# Patient Record
Sex: Female | Born: 1990 | Race: White | Hispanic: No | Marital: Single | Smoking: Never smoker
Health system: Southern US, Community
[De-identification: ages and names within clinical notes are randomized; demographics above are authoritative.]

## PROBLEM LIST (undated history)

## (undated) DIAGNOSIS — M419 Scoliosis, unspecified: Secondary | ICD-10-CM

## (undated) DIAGNOSIS — T7840XA Allergy, unspecified, initial encounter: Secondary | ICD-10-CM

## (undated) HISTORY — PX: WISDOM TOOTH EXTRACTION: SHX21

## (undated) HISTORY — DX: Allergy, unspecified, initial encounter: T78.40XA

## (undated) HISTORY — PX: DENTAL SURGERY: SHX609

---
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
## 2011-02-27 ENCOUNTER — Ambulatory Visit
Admission: RE | Admit: 2011-02-27 | Discharge: 2011-02-27 | Disposition: A | Discharge status: Home / Self Care | Payer: BC Managed Care – PPO | Source: Ambulatory Visit | Attending: Family Medicine | Admitting: Family Medicine

## 2011-02-27 ENCOUNTER — Other Ambulatory Visit: Payer: Self-pay | Admitting: Family Medicine

## 2011-02-27 DIAGNOSIS — R1031 Right lower quadrant pain: Secondary | ICD-10-CM

## 2011-02-27 MED ORDER — IOHEXOL 300 MG/ML  SOLN
100.0000 mL | Freq: Once | INTRAMUSCULAR | Status: AC | PRN
  Administered 2011-02-27: 100 mL via INTRAVENOUS

## 2011-03-12 ENCOUNTER — Other Ambulatory Visit: Payer: Self-pay | Admitting: Internal Medicine

## 2011-03-12 ENCOUNTER — Ambulatory Visit
Admission: RE | Admit: 2011-03-12 | Discharge: 2011-03-12 | Disposition: A | Discharge status: Home / Self Care | Payer: BC Managed Care – PPO | Source: Ambulatory Visit | Attending: Internal Medicine | Admitting: Internal Medicine

## 2011-03-12 DIAGNOSIS — R1031 Right lower quadrant pain: Secondary | ICD-10-CM

## 2011-06-05 ENCOUNTER — Emergency Department (HOSPITAL_COMMUNITY)
Admission: EM | Admit: 2011-06-05 | Discharge: 2011-06-06 | Disposition: A | Discharge status: Home / Self Care | Payer: BC Managed Care – PPO | Attending: Emergency Medicine | Admitting: Emergency Medicine

## 2011-06-05 DIAGNOSIS — M436 Torticollis: Secondary | ICD-10-CM | POA: Insufficient documentation

## 2011-06-05 DIAGNOSIS — M542 Cervicalgia: Secondary | ICD-10-CM | POA: Insufficient documentation

## 2011-06-05 DIAGNOSIS — H6120 Impacted cerumen, unspecified ear: Secondary | ICD-10-CM | POA: Insufficient documentation

## 2011-06-05 DIAGNOSIS — H9209 Otalgia, unspecified ear: Secondary | ICD-10-CM | POA: Insufficient documentation

## 2011-06-06 ENCOUNTER — Encounter (HOSPITAL_COMMUNITY): Payer: Self-pay | Admitting: Radiology

## 2011-06-06 ENCOUNTER — Emergency Department (HOSPITAL_COMMUNITY): Payer: BC Managed Care – PPO

## 2011-06-06 LAB — POCT I-STAT, CHEM 8
BUN: 11 mg/dL (ref 6–23)
Calcium, Ion: 1.21 mmol/L (ref 1.12–1.32)
Creatinine, Ser: 0.6 mg/dL (ref 0.50–1.10)
TCO2: 20 mmol/L (ref 0–100)

## 2011-06-06 MED ORDER — IOHEXOL 350 MG/ML SOLN
100.0000 mL | Freq: Once | INTRAVENOUS | Status: AC | PRN
  Administered 2011-06-06: 100 mL via INTRAVENOUS

## 2011-11-29 ENCOUNTER — Ambulatory Visit (INDEPENDENT_AMBULATORY_CARE_PROVIDER_SITE_OTHER): Payer: BC Managed Care – PPO

## 2011-11-29 DIAGNOSIS — Z23 Encounter for immunization: Secondary | ICD-10-CM

## 2011-11-29 DIAGNOSIS — J029 Acute pharyngitis, unspecified: Secondary | ICD-10-CM

## 2011-12-08 ENCOUNTER — Ambulatory Visit (INDEPENDENT_AMBULATORY_CARE_PROVIDER_SITE_OTHER): Payer: BC Managed Care – PPO

## 2011-12-08 DIAGNOSIS — J029 Acute pharyngitis, unspecified: Secondary | ICD-10-CM

## 2011-12-08 DIAGNOSIS — R05 Cough: Secondary | ICD-10-CM

## 2012-10-17 ENCOUNTER — Ambulatory Visit (INDEPENDENT_AMBULATORY_CARE_PROVIDER_SITE_OTHER): Payer: BC Managed Care – PPO | Admitting: Family Medicine

## 2012-10-17 VITALS — BP 100/60 | HR 78 | Temp 98.8°F | Resp 18 | Ht 64.0 in | Wt 131.0 lb

## 2012-10-17 DIAGNOSIS — G43909 Migraine, unspecified, not intractable, without status migrainosus: Secondary | ICD-10-CM

## 2012-10-17 MED ORDER — TRAMADOL HCL 50 MG PO TABS: 50.0000 mg | ORAL_TABLET | Freq: Three times a day (TID) | ORAL | Status: DC | PRN

## 2012-10-17 NOTE — Progress Notes (Signed)
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 Over the last 2 weeks she's been having more frequent and prolonged headaches. The longest lasting about 2 days. Yesterday at Thanksgiving dinner she had a bad headache and her family insisted she come seen. Her mother does have a history of migraines. The patient has been quite healthy, is not on any regular medications. Her last cycle was over 3 weeks ago. She is not sexually involved and on contraceptions she is under some stress with exams coming up. She does get some excess every couple days that he most recent headaches have been behind a left orbit and forehead area. She denies any prodromal type symptoms. She has not had any visual auras sometimes feels like her vision is a little bit blurry.  Objective: Pleasant alert healthy appearing young lady in no major distress this time. TMs are normal. Eyes PERRLA. Fundi benign. Throat clear. Neck supple without nodes. Chest is clear to auscultation. Heart regular without murmurs.  Assessment: Migraines  Plan: See patient instructions. Tramadol when necessary for worse pain.

## 2012-10-17 NOTE — Patient Instructions (Signed)
Take Tylenol 1000 mg every 6 or 8 hours if needed for headache pain. An alternative is ibuprofen 800 mg every 8-12 hours.  If headache pain persists despite this, use tramadol 50 mg every 6 when necessary pain.  Return if headache problems continued to persist  Daily try to get regular exercise and enough rest.

## 2012-10-30 ENCOUNTER — Ambulatory Visit (INDEPENDENT_AMBULATORY_CARE_PROVIDER_SITE_OTHER): Payer: BC Managed Care – PPO | Admitting: Physician Assistant

## 2012-10-30 VITALS — BP 124/76 | HR 91 | Temp 98.3°F | Resp 17 | Ht 64.0 in | Wt 123.0 lb

## 2012-10-30 DIAGNOSIS — R51 Headache: Secondary | ICD-10-CM

## 2012-10-30 MED ORDER — BUTALBITAL-APAP-CAFFEINE 50-325-40 MG PO TABS: 1.0000 | ORAL_TABLET | Freq: Four times a day (QID) | ORAL | Status: DC | PRN

## 2012-10-30 NOTE — Progress Notes (Signed)
Subjective:    Patient ID: Anne Flores, female    DOB: [birth year], 21 y.o.   MRN: [id number]  HPI   Anne Flores is a 21 yr old female complaining of worsening headaches over the last several weeks.  Headaches began about 4 weeks ago.  Describes the headaches as "stabs of pain" that lasted a few minutes.  She was seen here by Dr. Alwyn Ren about 2 weeks ago and prescribed tramadol for pain relief.  Since that time she states the headaches have been worsening both in frequency and intensity.  Now the headaches are happening once or twice per day and lasting 30-45 minutes at a time, whereas they previously resolved in minutes.  The pain is always above the left eye.  It does not radiate.  She does not have a prodrome or aura.  She does state that she "gets kind of numb" in both of her arms, and they tingle and shake with her worst headaches.  Denies dizziness, syncope, weakness, slurred speech.  Occasionally feels "unbalanced".  Denies nausea, vomiting, photophobia, or phonophobia.  Has been using tylenol, advil, or tramadol for HA relief, none of which provide much relief.  Does endorse that she's had some blurred vision in the left eye on occasion, not necessarily associated with the headaches.  No glass or contacts.  She does not have a headache currently.  Mother has a history of migraines.     Review of Systems  Constitutional: Negative for fever, chills and diaphoresis.  HENT: Positive for tinnitus (occasionally, not new). Negative for ear pain, neck pain and neck stiffness.   Respiratory: Negative for cough, shortness of breath and wheezing.   Cardiovascular: Negative.  Negative for chest pain and palpitations.  Gastrointestinal: Negative.   Musculoskeletal: Negative.   Skin: Negative.   Neurological: Positive for light-headedness, numbness (bilat arms) and headaches. Negative for dizziness, syncope and weakness.       Objective:   Physical Exam  Vitals reviewed. Constitutional: She is  oriented to person, place, and time. She appears well-developed and well-nourished. No distress.  HENT:  Head: Normocephalic and atraumatic.  Right Ear: Tympanic membrane and ear canal normal.  Left Ear: Tympanic membrane and ear canal normal.  Nose: Nose normal. Right sinus exhibits no maxillary sinus tenderness and no frontal sinus tenderness. Left sinus exhibits no maxillary sinus tenderness and no frontal sinus tenderness.  Mouth/Throat: Uvula is midline, oropharynx is clear and moist and mucous membranes are normal.  Eyes: Conjunctivae normal and EOM are normal. Pupils are equal, round, and reactive to light.  Fundoscopic exam:      The right eye shows no arteriolar narrowing, no AV nicking and no papilledema.       The left eye shows no arteriolar narrowing, no AV nicking and no papilledema.  Neck: Neck supple. No spinous process tenderness and no muscular tenderness present.  Cardiovascular: Normal rate, regular rhythm and normal heart sounds.   Pulses:      Radial pulses are 2+ on the right side, and 2+ on the left side.  Pulmonary/Chest: Effort normal and breath sounds normal. She has no wheezes. She has no rhonchi. She has no rales.  Lymphadenopathy:    She has no cervical adenopathy.  Neurological: She is alert and oriented to person, place, and time. She has normal strength. No cranial nerve deficit or sensory deficit.  Reflex Scores:      Bicep reflexes are 3+ on the right side and 3+ on the left side.  Patellar reflexes are 3+ on the right side and 3+ on the left side. Skin: Skin is warm and dry.      Filed Vitals:   10/30/12 1049  BP: 124/76  Pulse: 91  Temp: 98.3 F (36.8 C)  Resp: 17       Assessment & Plan:   1. Headache  butalbital-acetaminophen-caffeine (FIORICET) 50-325-40 MG per tablet, AMB referral to headache clinic    Anne Flores is a very pleasant 21 yr old female here for worsening headaches over the last two weeks.  Vitals are WNL, physical  exam is reassuring, and there are no focal neuro deficits.  As her symptoms are not classic migraine symptoms, I [facility].  Will try Fioricet for headache/migraine relief.  Discussed RTC precautions, specifically worsening HA, syncope, slurred speech, etc.  Patient voiced understanding and is in agreement with this plan.

## 2012-10-30 NOTE — Patient Instructions (Addendum)
I have put in a referral to Dr. Neale Burly at [facility].  You will get a phone call about scheduling this.  In the mean time, use Fioricet as needed for headaches.  You may take 1-2 tabs (start with just one) every 6 hours as needed (but do not exceed 6 tabs in 24 hours).  If you have new or worsening symptoms (i.e. Worsening headaches,  Dizziness, passing out, slurred speech, etc) come back in or proceed to the emergency department.

## 2012-12-27 ENCOUNTER — Ambulatory Visit (INDEPENDENT_AMBULATORY_CARE_PROVIDER_SITE_OTHER): Payer: BC Managed Care – PPO | Admitting: Family Medicine

## 2012-12-27 ENCOUNTER — Telehealth: Payer: Self-pay

## 2012-12-27 VITALS — BP 103/62 | HR 82 | Temp 98.2°F | Resp 16 | Ht 64.0 in | Wt 126.0 lb

## 2012-12-27 DIAGNOSIS — R21 Rash and other nonspecific skin eruption: Secondary | ICD-10-CM

## 2012-12-27 DIAGNOSIS — R002 Palpitations: Secondary | ICD-10-CM

## 2012-12-27 DIAGNOSIS — R5381 Other malaise: Secondary | ICD-10-CM

## 2012-12-27 DIAGNOSIS — G44229 Chronic tension-type headache, not intractable: Secondary | ICD-10-CM

## 2012-12-27 DIAGNOSIS — R5383 Other fatigue: Secondary | ICD-10-CM

## 2012-12-27 LAB — POCT UA - MICROSCOPIC ONLY
Bacteria, U Microscopic: NEGATIVE
RBC, urine, microscopic: NEGATIVE
WBC, Ur, HPF, POC: NEGATIVE
Yeast, UA: NEGATIVE

## 2012-12-27 LAB — POCT CBC
Hemoglobin: 12.5 g/dL (ref 12.2–16.2)
MPV: 9.3 fL (ref 0–99.8)
POC Granulocyte: 3.5 (ref 2–6.9)
POC MID %: 8.1 %M (ref 0–12)
RBC: 3.49 M/uL — AB (ref 4.04–5.48)

## 2012-12-27 LAB — POCT URINALYSIS DIPSTICK
Bilirubin, UA: NEGATIVE
Blood, UA: NEGATIVE
Glucose, UA: NEGATIVE
Nitrite, UA: NEGATIVE
Spec Grav, UA: >=1.03

## 2012-12-27 LAB — COMPREHENSIVE METABOLIC PANEL
AST: 16 U/L (ref 0–37)
Albumin: 4.6 g/dL (ref 3.5–5.2)
BUN: 11 mg/dL (ref 6–23)
Calcium: 9.4 mg/dL (ref 8.4–10.5)
Chloride: 105 mEq/L (ref 96–112)
Potassium: 3.7 mEq/L (ref 3.5–5.3)

## 2012-12-27 NOTE — Patient Instructions (Signed)
Continue all life his usual for now. We will see the results of your labs and decide if there is a need to pursue see anything else at this time. If you continue to have symptoms over the next couple of months return for a followup assessment.  Get me a copy of the labs you had done at the headache clinic

## 2012-12-27 NOTE — Telephone Encounter (Signed)
pts mother called very upset. States pt was seen this morning by dr hopper and came home upset and complaining that she believes 'dr hopper doesn't believe her' or her symptoms. She states that (mom) believes that it could be lyme disease because their dog recently tested positive for it and based on the patients symptoms she believes we should be testing her for it. Mom would like to speak to dr or his assistant about what happened during the visit and also what labs are being run on the patient.   Please call. Mom is listed on her hippa.   Best: [phone]  bf

## 2012-12-27 NOTE — Progress Notes (Signed)
Subjective: This young lady is here with a number of concerns. She's been feeling palpitations for the last week or so. This is a racing sensation in her chest. She says it is going on now. There is no pain with it. She is also had a sensation that things were falling when they were not falling or that she was staggering when she was not staggering. The headaches have continued to persist despite some of the changes that the neurologist made. She is following up with them this week. She was told that her HDL was too low, and was given a nut diet. She's been fatigued for the last 4 months so doesn't seem to be changing a lot. She denies excessive stress in school. She is a Primary school teacher. She lives at home and only has some relatively minor mother-daughter miscommunications. She thinks she has had some fevers off and on, feeling sweaty, but never having taken her temperature with it. She complains of a film sensation over her left eye intermittently. She has not seen an eye doctor.  After the fact she did tell me that her dog has had gallbladder disease and she wondered whether that was associated with her having these problems.  Objective: Well-developed young lady in no acute distress. TMs normal. Eyes PERRLA. Fundi benign. Discs flat. Throat clear. Neck supple without nodes or thyromegaly. Chest is clear to auscultation. Heart regular without murmurs gallops or arrhythmias. Abdomen soft without masses or tenderness. Extremities unremarkable. She also complained of a rash on both her hands, but says is doing a little better today. There is a little bit of dry skin on the hands.  Results for orders placed in visit on 12/27/12  POCT CBC      Result Value Range   WBC 5.5  4.6 - 10.2 K/uL   Lymph, poc 1.6  0.6 - 3.4   POC LYMPH PERCENT 28.5  10 - 50 %L   MID (cbc) 0.4  0 - 0.9   POC MID % 8.1  0 - 12 %M   POC Granulocyte 3.5  2 - 6.9   Granulocyte percent 63.4  37 - 80 %G   RBC 3.49 (*) 4.04 - 5.48  M/uL   Hemoglobin 12.5  12.2 - 16.2 g/dL   HCT, POC 16.1 (*) 09.6 - 47.9 %   MCV 95.3  80 - 97 fL   MCH, POC 35.8 (*) 27 - 31.2 pg   MCHC 37.5 (*) 31.8 - 35.4 g/dL   RDW, POC 04.5     Platelet Count, POC 165  142 - 424 K/uL   MPV 9.3  0 - 99.8 fL  POCT UA - MICROSCOPIC ONLY      Result Value Range   WBC, Ur, HPF, POC neg     RBC, urine, microscopic neg     Bacteria, U Microscopic neg     Mucus, UA moderate     Epithelial cells, urine per micros neg     Crystals, Ur, HPF, POC neg     Casts, Ur, LPF, POC tntc calcium oxalate     Yeast, UA neg    POCT URINALYSIS DIPSTICK      Result Value Range   Color, UA yellow     Clarity, UA cloudy     Glucose, UA neg     Bilirubin, UA neg     Ketones, UA neg     Spec Grav, UA >=1.030     Blood, UA neg  pH, UA 6.0     Protein, UA neg     Urobilinogen, UA 0.2     Nitrite, UA neg     Leukocytes, UA Negative     Assessment: Palpitations Chronic tension headache Film over eye Fatigue Rash on hands  Plan: Tried to reassure her until we get the results of her labs back. Even though the dog had a Lyme disease, do not feel like her symptoms are indicative of anything to be suspicious of. Reassured her. These may be all stress related symptoms even though she doesn't feel stressed. There is really no rash on the hands today to warrant giving medications for her. Return if in all worse.

## 2012-12-28 NOTE — Telephone Encounter (Signed)
Dr Alwyn Ren I think it would be a good idea to call mom to explain what happened at office visit.

## 2012-12-30 ENCOUNTER — Other Ambulatory Visit: Payer: BC Managed Care – PPO | Admitting: Family Medicine

## 2012-12-30 ENCOUNTER — Encounter: Payer: Self-pay | Admitting: *Deleted

## 2012-12-30 ENCOUNTER — Telehealth: Payer: Self-pay | Admitting: Family Medicine

## 2012-12-30 ENCOUNTER — Telehealth: Payer: Self-pay | Admitting: Radiology

## 2012-12-30 DIAGNOSIS — R5381 Other malaise: Secondary | ICD-10-CM

## 2012-12-30 DIAGNOSIS — Z139 Encounter for screening, unspecified: Secondary | ICD-10-CM

## 2012-12-30 NOTE — Telephone Encounter (Signed)
Spoke with Amy L. Who will arrange for patient to get f/u lyme test outpatient.

## 2012-12-30 NOTE — Telephone Encounter (Signed)
[facility] lab is doing the add on lab.

## 2012-12-30 NOTE — Telephone Encounter (Signed)
Spoke with mother on phone.  They are concerned since their dog got lyme, and patient had a tick bite last summer.  I will order a lyme test.  I spoke with the lab and Ginger will work on add on.

## 2012-12-30 NOTE — Telephone Encounter (Signed)
Lab only for the blood work. Mom advised. Order put in. The patient will not be charged office visit for this. No copay is to be collected, Amy

## 2013-01-04 NOTE — Telephone Encounter (Signed)
Call patient:  Blood test for lyme is negative.

## 2013-01-07 ENCOUNTER — Telehealth: Payer: Self-pay | Admitting: Radiology

## 2013-01-07 NOTE — Telephone Encounter (Signed)
Patient called about her labs. Advised they are pending.

## 2013-01-08 ENCOUNTER — Encounter: Payer: Self-pay | Admitting: Radiology

## 2013-02-11 ENCOUNTER — Other Ambulatory Visit: Payer: Self-pay | Admitting: Specialist

## 2013-02-11 DIAGNOSIS — G4485 Primary stabbing headache: Secondary | ICD-10-CM

## 2013-02-16 ENCOUNTER — Ambulatory Visit
Admission: RE | Admit: 2013-02-16 | Discharge: 2013-02-16 | Disposition: A | Discharge status: Home / Self Care | Payer: BC Managed Care – PPO | Source: Ambulatory Visit | Attending: Specialist | Admitting: Specialist

## 2013-02-16 DIAGNOSIS — G4485 Primary stabbing headache: Secondary | ICD-10-CM

## 2013-07-22 ENCOUNTER — Ambulatory Visit: Payer: BC Managed Care – PPO

## 2013-07-22 ENCOUNTER — Ambulatory Visit (INDEPENDENT_AMBULATORY_CARE_PROVIDER_SITE_OTHER): Payer: BC Managed Care – PPO | Admitting: Family Medicine

## 2013-07-22 VITALS — BP 108/62 | HR 71 | Temp 99.0°F | Resp 18 | Ht 65.0 in | Wt 131.0 lb

## 2013-07-22 DIAGNOSIS — R109 Unspecified abdominal pain: Secondary | ICD-10-CM

## 2013-07-22 DIAGNOSIS — J45909 Unspecified asthma, uncomplicated: Secondary | ICD-10-CM

## 2013-07-22 DIAGNOSIS — R8290 Unspecified abnormal findings in urine: Secondary | ICD-10-CM

## 2013-07-22 DIAGNOSIS — R82998 Other abnormal findings in urine: Secondary | ICD-10-CM

## 2013-07-22 LAB — POCT CBC
Granulocyte percent: 70.9 % (ref 37–80)
HCT, POC: 43.3 % (ref 37.7–47.9)
Hemoglobin: 13.9 g/dL (ref 12.2–16.2)
Lymph, poc: 2.4 (ref 0.6–3.4)
MCH, POC: 31.2 pg (ref 27–31.2)
MCHC: 32.1 g/dL (ref 31.8–35.4)
MCV: 97.2 fL — AB (ref 80–97)
MID (cbc): 0.7 (ref 0–0.9)
MPV: 8.8 fL (ref 0–99.8)
POC Granulocyte: 7.5 — AB (ref 2–6.9)
POC LYMPH PERCENT: 22.7 % (ref 10–50)
POC MID %: 6.4 %M (ref 0–12)
Platelet Count, POC: 239 10*3/uL (ref 142–424)
RBC: 4.45 M/uL (ref 4.04–5.48)
RDW, POC: 12 %
WBC: 10.6 10*3/uL — AB (ref 4.6–10.2)

## 2013-07-22 LAB — POCT UA - MICROSCOPIC ONLY
Bacteria, U Microscopic: NEGATIVE
Casts, Ur, LPF, POC: NEGATIVE
Crystals, Ur, HPF, POC: NEGATIVE
Mucus, UA: NEGATIVE
RBC, urine, microscopic: NEGATIVE
Yeast, UA: NEGATIVE

## 2013-07-22 LAB — POCT URINALYSIS DIPSTICK
Bilirubin, UA: NEGATIVE
Blood, UA: NEGATIVE
Glucose, UA: NEGATIVE
Nitrite, UA: NEGATIVE
Protein, UA: NEGATIVE
Spec Grav, UA: 1.025
Urobilinogen, UA: 0.2
pH, UA: 7

## 2013-07-22 LAB — POCT SEDIMENTATION RATE: POCT SED RATE: 12 mm/hr (ref 0–22)

## 2013-07-22 MED ORDER — ALBUTEROL SULFATE HFA 108 (90 BASE) MCG/ACT IN AERS: 2.0000 | INHALATION_SPRAY | Freq: Four times a day (QID) | RESPIRATORY_TRACT | Status: DC | PRN

## 2013-07-22 NOTE — Progress Notes (Signed)
[facility]:  Office Visit  Chief Complaint:  Chief Complaint  Patient presents with  . Abdominal Pain    x1 week   . rx refills    inhaler     HPI: Anne Flores is a 22 y.o. female who complains of  1 week history of abd pain, bilateral upper abd pain after food ingestion ( does not matter type of food) and also BM.  Throbbing pain, lasts for a few minutes. Goes away by itself. + nausea, but no vomiting.  Bland diet and fruits only She thought it was food posioning, she ate soemthinga week ago and had to be in the bathroom for 1 day and 1 haf.  Denies fevers or chillsDeneis bloody stool. She thinks it was the [place] that she ate.  Due to have her cycle on 9/20, she has had a history of ovarian cyst on right side Does not feel anything like ovarian cyst No urinary sxs  No weight loss or [place]  Needs refills on albuterol for EIA   Past Medical History  Diagnosis Date  . Asthma   . Allergy    Past Surgical History  Procedure Laterality Date  . Wisdom tooth extraction     History   Social History  . Marital Status: Single    Spouse Name: N/A    Number of Children: N/A  . Years of Education: N/A   Social History Main Topics  . Smoking status: Never Smoker   . Smokeless tobacco: None  . Alcohol Use: No  . Drug Use: No  . Sexual Activity: No   Other Topics Concern  . None   Social History Narrative  . None   Family History  Problem Relation Age of Onset  . Migraines Mother   . Stroke Maternal Grandmother   . Cancer Paternal Grandfather   . Stroke Paternal Uncle    Allergies  Allergen Reactions  . Ciprofloxacin   . Pertussis Vaccines    Prior to Admission medications   Medication Sig Start Date End Date Taking? Authorizing Provider  albuterol (PROVENTIL HFA;VENTOLIN HFA) 108 (90 BASE) MCG/ACT inhaler Inhale 2 puffs into the lungs every 6 (six) hours as needed for wheezing.   Yes Historical Provider, MD  Multiple  Vitamins-Minerals (MULTIVITAMIN WITH MINERALS) tablet Take 1 tablet by mouth daily.   Yes Historical Provider, MD     ROS: The patient denies fevers, chills, night sweats, unintentional weight loss, chest pain, palpitations, wheezing, dyspnea on exertion,  dysuria, hematuria, melena, numbness, weakness, or tingling.   All other systems have been reviewed and were otherwise negative with the exception of those mentioned in the HPI and as above.    PHYSICAL EXAM: Filed Vitals:   07/22/13 1641  BP: 108/62  Pulse: 71  Temp: 99 F (37.2 C)  Resp: 18   Filed Vitals:   07/22/13 1641  Height: 5\' 5"  (1.651 m)  Weight: 131 lb (59.421 kg)   Body mass index is 21.8 kg/(m^2).  General: Alert, no acute distress HEENT:  Normocephalic, atraumatic, oropharynx patent. EOMI, PERRLA Cardiovascular:  Regular rate and rhythm, no rubs murmurs or gallops.  No Carotid bruits, radial pulse intact. No pedal edema.  Respiratory: Clear to auscultation bilaterally.  No wheezes, rales, or rhonchi.  No cyanosis, no use of accessory musculature GI: No organomegaly, abdomen is soft and mildly -tender bilateral upper abd, positive bowel sounds.  No masses. No guarding Skin: No rashes. Neurologic: Facial musculature symmetric. Psychiatric: Patient  is appropriate throughout our interaction. Lymphatic: No cervical lymphadenopathy Musculoskeletal: Gait intact.   LABS: Results for orders placed in visit on 07/22/13  POCT CBC      Result Value Range   WBC 10.6 (*) 4.6 - 10.2 K/uL   Lymph, poc 2.4  0.6 - 3.4   POC LYMPH PERCENT 22.7  10 - 50 %L   MID (cbc) 0.7  0 - 0.9   POC MID % 6.4  0 - 12 %M   POC Granulocyte 7.5 (*) 2 - 6.9   Granulocyte percent 70.9  37 - 80 %G   RBC 4.45  4.04 - 5.48 M/uL   Hemoglobin 13.9  12.2 - 16.2 g/dL   HCT, POC 16.1  09.6 - 47.9 %   MCV 97.2 (*) 80 - 97 fL   MCH, POC 31.2  27 - 31.2 pg   MCHC 32.1  31.8 - 35.4 g/dL   RDW, POC 04.5     Platelet Count, POC 239  142 - 424 K/uL    MPV 8.8  0 - 99.8 fL  POCT UA - MICROSCOPIC ONLY      Result Value Range   WBC, Ur, HPF, POC 0-1     RBC, urine, microscopic neg     Bacteria, U Microscopic neg     Mucus, UA neg     Epithelial cells, urine per micros 8-12     Crystals, Ur, HPF, POC neg     Casts, Ur, LPF, POC neg     Yeast, UA neg     Amorphous large    POCT URINALYSIS DIPSTICK      Result Value Range   Color, UA yellow     Clarity, UA cloudy     Glucose, UA neg     Bilirubin, UA neg     Ketones, UA trace     Spec Grav, UA 1.025     Blood, UA neg     pH, UA 7.0     Protein, UA neg     Urobilinogen, UA 0.2     Nitrite, UA neg     Leukocytes, UA small (1+)       EKG/XRAY:   Primary read interpreted by Dr. Conley Rolls at [facility]. No obstruction + fecal load No free air + scoliosis   ASSESSMENT/PLAN: Encounter Diagnoses  Name Primary?  Marland Kitchen Asthma Yes  . Abdominal pain, unspecified site    Constipation, use Miralax Trace leuks will culture urine Will monitor for worsening  Urinary sxs, currently none  No abx for now Refilled albuterol Gross sideeffects, risk and benefits, and alternatives of medications d/w patient. Patient is aware that all medications have potential sideeffects and we are unable to predict every sideeffect or drug-drug interaction that may occur.  Hamilton Capri PHUONG, DO 07/22/2013 6:16 PM

## 2013-07-23 LAB — COMPREHENSIVE METABOLIC PANEL WITH GFR
Albumin: 4.8 g/dL (ref 3.5–5.2)
Alkaline Phosphatase: 55 U/L (ref 39–117)
BUN: 12 mg/dL (ref 6–23)
Creat: 0.75 mg/dL (ref 0.50–1.10)
Glucose, Bld: 83 mg/dL (ref 70–99)
Potassium: 4.5 meq/L (ref 3.5–5.3)
Total Bilirubin: 0.5 mg/dL (ref 0.3–1.2)

## 2013-07-23 LAB — COMPREHENSIVE METABOLIC PANEL
ALT: 9 U/L (ref 0–35)
AST: 16 U/L (ref 0–37)
CO2: 24 mEq/L (ref 19–32)
Calcium: 9.8 mg/dL (ref 8.4–10.5)
Chloride: 102 mEq/L (ref 96–112)
Sodium: 136 mEq/L (ref 135–145)
Total Protein: 7.5 g/dL (ref 6.0–8.3)

## 2013-07-23 LAB — LIPASE: Lipase: <10 U/L (ref 0–75)

## 2013-07-25 LAB — URINE CULTURE: Colony Count: >=100000

## 2013-07-29 ENCOUNTER — Telehealth: Payer: Self-pay | Admitting: Family Medicine

## 2013-07-29 NOTE — Telephone Encounter (Signed)
LM regarding labs, culture grew out multiple bacteria.

## 2013-12-13 ENCOUNTER — Ambulatory Visit (INDEPENDENT_AMBULATORY_CARE_PROVIDER_SITE_OTHER): Payer: BC Managed Care – PPO | Admitting: Emergency Medicine

## 2013-12-13 VITALS — BP 100/60 | HR 97 | Temp 98.1°F | Resp 16 | Ht 64.0 in | Wt 130.0 lb

## 2013-12-13 DIAGNOSIS — J018 Other acute sinusitis: Secondary | ICD-10-CM

## 2013-12-13 MED ORDER — AMOXICILLIN-POT CLAVULANATE 875-125 MG PO TABS: 1.0000 | ORAL_TABLET | Freq: Two times a day (BID) | ORAL | Status: DC

## 2013-12-13 MED ORDER — PSEUDOEPHEDRINE-GUAIFENESIN ER 60-600 MG PO TB12: 1.0000 | ORAL_TABLET | Freq: Two times a day (BID) | ORAL | Status: DC

## 2013-12-13 NOTE — Progress Notes (Signed)
[facility] [address], [place] [id number] [phone]  Date:  12/13/2013   Name:  Anne Flores   DOB:  01-25-1991   MRN:  [id number]  PCP:  No primary provider on file.    Chief Complaint: Cough and Sore Throat   History of Present Illness:  Anne Flores is a 23 y.o. very pleasant female patient who presents with the following:  1 week illness.  Has nasal congestion and drainage.  Mucopurulent.  Has non productive cough. No wheezing or shortness of breath.  No nausea or vomiting.  No stool change or rash. No fever or chills. No improvement with over the counter medications or other home remedies. Denies other complaint or health concern today.   Patient Active Problem List   Diagnosis Date Noted  . ALLERGIC RHINITIS CAUSE UNSPECIFIED 09/20/2010  . ASTHMA 09/20/2010  . SCOLIOSIS 09/20/2010    Past Medical History  Diagnosis Date  . Asthma   . Allergy     Past Surgical History  Procedure Laterality Date  . Wisdom tooth extraction      History  Substance Use Topics  . Smoking status: Never Smoker   . Smokeless tobacco: Not on file  . Alcohol Use: No    Family History  Problem Relation Age of Onset  . Migraines Mother   . Stroke Maternal Grandmother   . Cancer Paternal Grandfather   . Stroke Paternal Uncle     Allergies  Allergen Reactions  . Ciprofloxacin   . Pertussis Vaccines     Medication list has been reviewed and updated.  Current Outpatient Prescriptions on File Prior to Visit  Medication Sig Dispense Refill  . albuterol (PROVENTIL HFA;VENTOLIN HFA) 108 (90 BASE) MCG/ACT inhaler Inhale 2 puffs into the lungs every 6 (six) hours as needed for wheezing.  1 Inhaler  11  . Multiple Vitamins-Minerals (MULTIVITAMIN WITH MINERALS) tablet Take 1 tablet by mouth daily.       No current facility-administered medications on file prior to visit.    Review of Systems:  As per HPI, otherwise negative.    Physical  Examination: Filed Vitals:   12/13/13 0913  BP: 100/60  Pulse: 97  Temp: 98.1 F (36.7 C)  Resp: 16   Filed Vitals:   12/13/13 0913  Height: 5\' 4"  (1.626 m)  Weight: 130 lb (58.968 kg)   Body mass index is 22.3 kg/(m^2). Ideal Body Weight: Weight in (lb) to have BMI = 25: 145.3  GEN: WDWN, NAD, Non-toxic, A & O x 3 HEENT: Atraumatic, Normocephalic. Neck supple. No masses, No LAD. Ears and Nose: No external deformity. CV: RRR, No M/G/R. No JVD. No thrill. No extra heart sounds. PULM: CTA B, no wheezes, crackles, rhonchi. No retractions. No resp. distress. No accessory muscle use. ABD: S, NT, ND, +BS. No rebound. No HSM. EXTR: No c/c/e NEURO Normal gait.  PSYCH: Normally interactive. Conversant. Not depressed or anxious appearing.  Calm demeanor.    Assessment and Plan: Sinusitis mucinex d augmentin  Signed,  Phillips OdorJeffery Loralie Malta, MD

## 2013-12-13 NOTE — Patient Instructions (Signed)

## 2013-12-22 ENCOUNTER — Ambulatory Visit (INDEPENDENT_AMBULATORY_CARE_PROVIDER_SITE_OTHER): Payer: BC Managed Care – PPO | Admitting: Family Medicine

## 2013-12-22 VITALS — BP 112/74 | HR 108 | Temp 99.8°F | Resp 16 | Ht 64.0 in | Wt 131.8 lb

## 2013-12-22 DIAGNOSIS — R05 Cough: Secondary | ICD-10-CM

## 2013-12-22 DIAGNOSIS — R059 Cough, unspecified: Secondary | ICD-10-CM

## 2013-12-22 DIAGNOSIS — R6889 Other general symptoms and signs: Secondary | ICD-10-CM

## 2013-12-22 LAB — POCT INFLUENZA A/B
INFLUENZA A, POC: NEGATIVE
Influenza B, POC: NEGATIVE

## 2013-12-22 MED ORDER — HYDROCODONE-HOMATROPINE 5-1.5 MG/5ML PO SYRP: 5.0000 mL | ORAL_SOLUTION | ORAL | Status: DC | PRN

## 2013-12-22 MED ORDER — OSELTAMIVIR PHOSPHATE 75 MG PO CAPS: 75.0000 mg | ORAL_CAPSULE | Freq: Two times a day (BID) | ORAL | Status: DC

## 2013-12-22 NOTE — Progress Notes (Signed)
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 She has felt a little achy in her legs. She is coughing and blowing her nose. She felt a little bit warm but didn't know she had a fever until she got here and it was 99.8. She does not smoke.  She does have a Provera that she uses on an as-needed basis. Other regular medications.  Objective: Pleasant young lady in no major distress. Her TMs are normal. Nose congested. Throat clear. Neck supple without significant nodes. Chest is clear to auscultation. Heart regular without murmurs.  Assessment: Flulike illness  Plan: Flu swab  Results for orders placed in visit on 12/22/13  POCT INFLUENZA A/B      Result Value Range   Influenza A, POC Negative     Influenza B, POC Negative     Patient is just finishing a course of Augmentin for sinusitis. If she finishes the Augmentin and a couple of days thereafter seems to be getting in all worse on the sinuses I will be happy to call in something different for her, probably Omnicef on Biaxin for a couple of weeks.  Explained to her that the flu test misses a lot of flu cases, and gave her the option of taking Tamiflu. This probably is either influenza or another viral infection on top of her sinusitis. She elected to go ahead and get the Tamiflu.

## 2013-12-22 NOTE — Patient Instructions (Signed)
Drink plenty of fluids  Get enough rest  If running a fever stay out of school tomorrow.  If not improving by the weekend after several days off of the Augmentin call back and we will put you back on another antibiotic as per my note.  Return if worse

## 2014-03-07 ENCOUNTER — Ambulatory Visit (INDEPENDENT_AMBULATORY_CARE_PROVIDER_SITE_OTHER): Payer: BC Managed Care – PPO | Admitting: Family Medicine

## 2014-03-07 VITALS — BP 122/76 | HR 75 | Temp 98.0°F | Resp 17 | Ht 65.0 in | Wt 134.0 lb

## 2014-03-07 DIAGNOSIS — M65839 Other synovitis and tenosynovitis, unspecified forearm: Secondary | ICD-10-CM

## 2014-03-07 DIAGNOSIS — M65849 Other synovitis and tenosynovitis, unspecified hand: Secondary | ICD-10-CM

## 2014-03-07 DIAGNOSIS — M659 Synovitis and tenosynovitis, unspecified: Secondary | ICD-10-CM

## 2014-03-07 DIAGNOSIS — H6120 Impacted cerumen, unspecified ear: Secondary | ICD-10-CM

## 2014-03-07 MED ORDER — PREDNISONE 20 MG PO TABS: ORAL_TABLET | ORAL | Status: DC

## 2014-03-07 NOTE — Addendum Note (Signed)
Addended by: Bonnee QuinWITHERSPOON, Chessica Audia J on: 03/07/2014 02:35 PM   Modules accepted: Orders

## 2014-03-07 NOTE — Progress Notes (Signed)
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 She complains about loss of hearing in her right ear without pain. She's had no trauma and no fever.  Patient also complains about right sided wrist pain over the distal radius which is worsened by moving her thumb.  Objective: No acute distress Friendly and cooperative Bilateral cerumen impactions which were lavaged clear by my assistant. Patient has positive Finkelstein's test. The wrist appears to be normal however with good range of motion.  Assessment: DeQuervain synovitis, cerumen impaction which has been resolved.  Synovitis or tenosynovitis of wrist - Plan: predniSONE (DELTASONE) 20 MG tablet  Cerumen impaction - Plan: predniSONE (DELTASONE) 20 MG tablet  Signed, Elvina SidleKurt Dawnita Molner, MD

## 2014-03-07 NOTE — Patient Instructions (Signed)
Cerumen Impaction A cerumen impaction is when the wax in your ear forms a plug. This plug usually causes reduced hearing. Sometimes it also causes an earache or dizziness. Removing a cerumen impaction can be difficult and painful. The wax sticks to the ear canal. The canal is sensitive and bleeds easily. If you try to remove a heavy wax buildup with a cotton tipped swab, you may push it in further. Irrigation with water, suction, and small ear curettes may be used to clear out the wax. If the impaction is fixed to the skin in the ear canal, ear drops may be needed for a few days to loosen the wax. People who build up a lot of wax frequently can use ear wax removal products available in your local drugstore. SEEK MEDICAL CARE IF:  You develop an earache, increased hearing loss, or marked dizziness. Document Released: 12/13/2004 Document Revised: 01/28/2012 Document Reviewed: 02/02/2010 [facility] Patient Information 2014 [place], [place]. De Quervain's Disease Suzette BattiestDe Quervain's disease is a condition often seen in racquet sports where there is a soreness (inflammation) in the cord like structures (tendons) which attach muscle to bone on the thumb side of the wrist. There may be a tightening of the tissuesaround the tendons. This condition is often helped by giving up or modifying the activity which caused it. When conservative treatment does not help, surgery may be required. Conservative treatment could include changes in the activity which brought about the problem or made it worse. Anti-inflammatory medications and injections may be used to help decrease the inflammation and help with pain control. Your caregiver will help you determine which is best for you. DIAGNOSIS  Often the diagnosis (learning what is wrong) can be made by examination. Sometimes x-rays are required. HOME CARE INSTRUCTIONS   Apply ice to the sore area for 15-20 minutes, 03-04 times per day while awake. Put the ice in a plastic bag and  place a towel between the bag of ice and your skin. This is especially helpful if it can be done after all activities involving the sore wrist.  Temporary splinting may help.  Only take over-the-counter or prescription medicines for pain, discomfort or fever as directed by your caregiver. SEEK MEDICAL CARE IF:   Pain relief is not obtained with medications, or if you have increasing pain and seem to be getting worse rather than better. MAKE SURE YOU:   Understand these instructions.  Will watch your condition.  Will get help right away if you are not doing well or get worse. Document Released: 07/31/2001 Document Revised: 01/28/2012 Document Reviewed: 11/05/2005 [facility] Patient Information 2014 [place], [place].

## 2014-04-20 ENCOUNTER — Ambulatory Visit (INDEPENDENT_AMBULATORY_CARE_PROVIDER_SITE_OTHER): Payer: BC Managed Care – PPO | Admitting: Physician Assistant

## 2014-04-20 VITALS — BP 120/72 | HR 66 | Temp 98.3°F | Resp 16 | Ht 63.75 in | Wt 136.6 lb

## 2014-04-20 DIAGNOSIS — Z111 Encounter for screening for respiratory tuberculosis: Secondary | ICD-10-CM

## 2014-04-20 DIAGNOSIS — Z0289 Encounter for other administrative examinations: Secondary | ICD-10-CM

## 2014-04-20 NOTE — Progress Notes (Signed)

## 2014-04-21 NOTE — Progress Notes (Signed)
   Subjective:    Patient ID: Anne Flores, female    DOB: January 30, 1991, 23 y.o.   MRN: [id number]  HPI 23 year old female presents for form completion for camp physical. 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 She is very excited about this.   No known medical problems or daily medications.    Immunizations UTD as far as she knows - had all baby shots but did not bring record of this today.    See TB questionnaire below    Review of Systems  Constitutional: Negative.   HENT: Negative.   Eyes: Negative.   Respiratory: Negative.   Cardiovascular: Negative.   Gastrointestinal: Negative.   Endocrine: Negative.   Genitourinary: Negative.   Musculoskeletal: Negative.   Skin: Negative.   Allergic/Immunologic: Positive for environmental allergies.  Neurological: Negative.   Hematological: Negative.   Psychiatric/Behavioral: Negative.        Objective:   Physical Exam  Constitutional: She is oriented to person, place, and time. She appears well-developed and well-nourished.  HENT:  Head: Normocephalic and atraumatic.  Right Ear: Hearing, tympanic membrane, external ear and ear canal normal.  Left Ear: Hearing, tympanic membrane, external ear and ear canal normal.  Mouth/Throat: Uvula is midline, oropharynx is clear and moist and mucous membranes are normal.  Eyes: Conjunctivae are normal.  Neck: Normal range of motion. Neck supple. No thyromegaly present.  Cardiovascular: Normal rate, regular rhythm and normal heart sounds.   Pulmonary/Chest: Effort normal and breath sounds normal.  Abdominal: Soft. Bowel sounds are normal. There is no tenderness. There is no rebound and no guarding.  Musculoskeletal: Normal range of motion.       Right shoulder: Normal.       Left shoulder: Normal.       Right knee: Normal.       Left knee: Normal.  Lymphadenopathy:    She has no cervical adenopathy.  Neurological: She is alert and oriented to person, place,  and time. She has normal strength.  Reflex Scores:      Brachioradialis reflexes are 2+ on the right side and 2+ on the left side.      Patellar reflexes are 2+ on the right side and 2+ on the left side. Psychiatric: She has a normal mood and affect. Her behavior is normal. Judgment and thought content normal.          Assessment & Plan:   Other general medical examination for administrative purposes  Screening-pulmonary TB - Plan: TB Skin Test  TB test placed Forms completed. Recommend she locate immunization records and submit them with form.  Return in 48-72 hours for TB read Follow up as needed.

## 2014-04-22 ENCOUNTER — Ambulatory Visit (INDEPENDENT_AMBULATORY_CARE_PROVIDER_SITE_OTHER): Payer: BC Managed Care – PPO

## 2014-04-22 DIAGNOSIS — Z111 Encounter for screening for respiratory tuberculosis: Secondary | ICD-10-CM

## 2014-04-22 LAB — TB SKIN TEST
Induration: 0 mm
TB Skin Test: NEGATIVE

## 2014-07-28 ENCOUNTER — Ambulatory Visit (INDEPENDENT_AMBULATORY_CARE_PROVIDER_SITE_OTHER): Payer: BC Managed Care – PPO | Admitting: Family Medicine

## 2014-07-28 VITALS — BP 102/61 | HR 71 | Temp 97.9°F | Resp 16 | Ht 64.0 in | Wt 139.4 lb

## 2014-07-28 DIAGNOSIS — H9209 Otalgia, unspecified ear: Secondary | ICD-10-CM

## 2014-07-28 DIAGNOSIS — H9202 Otalgia, left ear: Secondary | ICD-10-CM

## 2014-07-28 MED ORDER — MONTELUKAST SODIUM 10 MG PO TABS: 10.0000 mg | ORAL_TABLET | Freq: Every day | ORAL | Status: DC

## 2014-07-28 NOTE — Progress Notes (Signed)
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 who comes in with vibrating sensation in his left ear. There's been no pain or loss of hearing. This is been going on for a week.  Objective: Patient has bilateral cerumen impactions.  Ears lavaged clear by Dr. Milus Glazier Fluttering continues  Assessment:  SOM with cerumen impaction  Plan:  Montelukast 10 mg daily If symptoms are persisting in 2 days, ENT consult  Elvina Sidle, MD

## 2014-08-02 ENCOUNTER — Ambulatory Visit (INDEPENDENT_AMBULATORY_CARE_PROVIDER_SITE_OTHER): Payer: BC Managed Care – PPO | Admitting: Internal Medicine

## 2014-08-02 VITALS — BP 110/66 | HR 76 | Temp 98.0°F | Resp 18 | Ht 64.0 in | Wt 139.0 lb

## 2014-08-02 DIAGNOSIS — H9319 Tinnitus, unspecified ear: Secondary | ICD-10-CM

## 2014-08-02 DIAGNOSIS — R05 Cough: Secondary | ICD-10-CM

## 2014-08-02 DIAGNOSIS — H9312 Tinnitus, left ear: Secondary | ICD-10-CM

## 2014-08-02 DIAGNOSIS — J209 Acute bronchitis, unspecified: Secondary | ICD-10-CM

## 2014-08-02 DIAGNOSIS — R059 Cough, unspecified: Secondary | ICD-10-CM

## 2014-08-02 MED ORDER — AZITHROMYCIN 250 MG PO TABS: ORAL_TABLET | ORAL | Status: DC

## 2014-08-02 NOTE — Patient Instructions (Signed)
Acute Bronchitis Bronchitis is inflammation of the airways that extend from the windpipe into the lungs (bronchi). The inflammation often causes mucus to develop. This leads to a cough, which is the most common symptom of bronchitis.  In acute bronchitis, the condition usually develops suddenly and goes away over time, usually in a couple weeks. Smoking, allergies, and asthma can make bronchitis worse. Repeated episodes of bronchitis may cause further lung problems.  CAUSES Acute bronchitis is most often caused by the same virus that causes a cold. The virus can spread from person to person (contagious) through coughing, sneezing, and touching contaminated objects. SIGNS AND SYMPTOMS   Cough.   Fever.   Coughing up mucus.   Body aches.   Chest congestion.   Chills.   Shortness of breath.   Sore throat.  DIAGNOSIS  Acute bronchitis is usually diagnosed through a physical exam. Your health care provider will also ask you questions about your medical history. Tests, such as chest X-rays, are sometimes done to rule out other conditions.  TREATMENT  Acute bronchitis usually goes away in a couple weeks. Oftentimes, no medical treatment is necessary. Medicines are sometimes given for relief of fever or cough. Antibiotic medicines are usually not needed but may be prescribed in certain situations. In some cases, an inhaler may be recommended to help reduce shortness of breath and control the cough. A cool mist vaporizer may also be used to help thin bronchial secretions and make it easier to clear the chest.  HOME CARE INSTRUCTIONS  Get plenty of rest.   Drink enough fluids to keep your urine clear or pale yellow (unless you have a medical condition that requires fluid restriction). Increasing fluids may help thin your respiratory secretions (sputum) and reduce chest congestion, and it will prevent dehydration.   Take medicines only as directed by your health care provider.  If  you were prescribed an antibiotic medicine, finish it all even if you start to feel better.  Avoid smoking and secondhand smoke. Exposure to cigarette smoke or irritating chemicals will make bronchitis worse. If you are a smoker, consider using nicotine gum or skin patches to help control withdrawal symptoms. Quitting smoking will help your lungs heal faster.   Reduce the chances of another bout of acute bronchitis by washing your hands frequently, avoiding people with cold symptoms, and trying not to touch your hands to your mouth, nose, or eyes.   Keep all follow-up visits as directed by your health care provider.  SEEK MEDICAL CARE IF: Your symptoms do not improve after 1 week of treatment.  SEEK IMMEDIATE MEDICAL CARE IF:  You develop an increased fever or chills.   You have chest pain.   You have severe shortness of breath.  You have bloody sputum.   You develop dehydration.  You faint or repeatedly feel like you are going to pass out.  You develop repeated vomiting.  You develop a severe headache. MAKE SURE YOU:   Understand these instructions.  Will watch your condition.  Will get help right away if you are not doing well or get worse. Document Released: 12/13/2004 Document Revised: 03/22/2014 Document Reviewed: 04/28/2013 [facility] Patient Information 2015 [place], [place]. This information is not intended to replace advice given to you by your health care provider. Make sure you discuss any questions you have with your health care provider. Tinnitus Sounds you hear in your ears and coming from within the ear is called tinnitus. This can be a symptom of many ear  disorders. It is often associated with hearing loss.  Tinnitus can be seen with:  Infections.  Ear blockages such as wax buildup.  Meniere's disease.  Ear damage.  Inherited.  Occupational causes. While irritating, it is not usually a threat to health. When the cause of the tinnitus is wax,  infection in the middle ear, or foreign body it is easily treated. Hearing loss will usually be reversible.  TREATMENT  When treating the underlying cause does not get rid of tinnitus, it may be necessary to get rid of the unwanted sound by covering it up with more pleasant background noises. This may include music, the radio etc. There are tinnitus maskers which can be worn which produce background noise to cover up the tinnitus. Avoid all medications which tend to make tinnitus worse such as alcohol, caffeine, aspirin, and nicotine. There are many soothing background tapes such as rain, ocean, thunderstorms, etc. These soothing sounds help with sleeping or resting. Keep all follow-up appointments and referrals. This is important to identify the cause of the problem. It also helps avoid complications, impaired hearing, disability, or chronic pain. Document Released: 11/05/2005 Document Revised: 01/28/2012 Document Reviewed: 06/23/2008 [facility] Patient Information 2015 [place], [place]. This information is not intended to replace advice given to you by your health care provider. Make sure you discuss any questions you have with your health care provider.

## 2014-08-02 NOTE — Progress Notes (Signed)
   Subjective:    Patient ID: Anne Flores, female    DOB: Jan 29, 1991, 23 y.o.   MRN: [id number]  HPI 23 y/o female  Presents with Left ear discomfort was seen by Dr. Milus Glazier on 9/9 given Singular no relief Went caving in [place] for 07-30-12 now has sore throat Coughing yellow phlem Nose congestion (yellow) No sinus pain No nausea No diarrhea No vomiting No headache No chest pain Patient does not want flu shot at this time.       Review of Systems     Objective:   Physical Exam  Constitutional: She is oriented to person, place, and time. She appears well-developed and well-nourished. No distress.  HENT:  Head: Normocephalic.  Right Ear: External ear normal.  Left Ear: External ear normal.  Nose: Nose normal.  Mouth/Throat: Posterior oropharyngeal erythema present.  Eyes: EOM are normal.  Neck: Normal range of motion. Neck supple.  Cardiovascular: Normal rate, regular rhythm and normal heart sounds.   Pulmonary/Chest: Effort normal. No respiratory distress. She has no wheezes. She has rhonchi. She has no rales. She exhibits no tenderness.  Neurological: She is alert and oriented to person, place, and time. No cranial nerve deficit. She exhibits normal muscle tone. Coordination normal.  Skin: No rash noted.  Psychiatric: She has a normal mood and affect. Her behavior is normal. Judgment and thought content normal.          Assessment & Plan:  SOM with tinnitis Bronchitis

## 2014-10-23 ENCOUNTER — Encounter (HOSPITAL_BASED_OUTPATIENT_CLINIC_OR_DEPARTMENT_OTHER): Payer: Self-pay

## 2014-10-23 ENCOUNTER — Emergency Department (HOSPITAL_BASED_OUTPATIENT_CLINIC_OR_DEPARTMENT_OTHER)
Admission: EM | Admit: 2014-10-23 | Discharge: 2014-10-23 | Disposition: A | Discharge status: Home / Self Care | Payer: BC Managed Care – PPO | Attending: Emergency Medicine | Admitting: Emergency Medicine

## 2014-10-23 DIAGNOSIS — R110 Nausea: Secondary | ICD-10-CM | POA: Diagnosis not present

## 2014-10-23 DIAGNOSIS — Z792 Long term (current) use of antibiotics: Secondary | ICD-10-CM | POA: Insufficient documentation

## 2014-10-23 DIAGNOSIS — Z8739 Personal history of other diseases of the musculoskeletal system and connective tissue: Secondary | ICD-10-CM | POA: Diagnosis not present

## 2014-10-23 DIAGNOSIS — R42 Dizziness and giddiness: Secondary | ICD-10-CM | POA: Insufficient documentation

## 2014-10-23 DIAGNOSIS — Z79899 Other long term (current) drug therapy: Secondary | ICD-10-CM | POA: Insufficient documentation

## 2014-10-23 DIAGNOSIS — J029 Acute pharyngitis, unspecified: Secondary | ICD-10-CM | POA: Diagnosis not present

## 2014-10-23 DIAGNOSIS — J45901 Unspecified asthma with (acute) exacerbation: Secondary | ICD-10-CM | POA: Insufficient documentation

## 2014-10-23 DIAGNOSIS — J45909 Unspecified asthma, uncomplicated: Secondary | ICD-10-CM

## 2014-10-23 DIAGNOSIS — R079 Chest pain, unspecified: Secondary | ICD-10-CM | POA: Diagnosis present

## 2014-10-23 HISTORY — DX: Scoliosis, unspecified: M41.9

## 2014-10-23 MED ORDER — ALBUTEROL SULFATE HFA 108 (90 BASE) MCG/ACT IN AERS
2.0000 | INHALATION_SPRAY | Freq: Four times a day (QID) | RESPIRATORY_TRACT | Status: DC | PRN
  Administered 2014-10-23: 2 via RESPIRATORY_TRACT
  Filled 2014-10-23: qty 6.7

## 2014-10-23 NOTE — Discharge Instructions (Signed)
Asthma °Asthma is a condition of the lungs in which the airways tighten and narrow. Asthma can make it hard to breathe. Asthma cannot be cured, but medicine and lifestyle changes can help control it. Asthma may be started (triggered) by: °· Animal skin flakes (dander). °· Dust. °· Cockroaches. °· Pollen. °· Mold. °· Smoke. °· Cleaning products. °· Hair sprays or aerosol sprays. °· Paint fumes or strong smells. °· Cold air, weather changes, and winds. °· Crying or laughing hard. °· Stress. °· Certain medicines or drugs. °· Foods, such as dried fruit, potato chips, and sparkling grape juice. °· Infections or conditions (colds, flu). °· Exercise. °· Certain medical conditions or diseases. °· Exercise or tiring activities. °HOME CARE  °· Take medicine as told by your doctor. °· Use a peak flow meter as told by your doctor. A peak flow meter is a tool that measures how well the lungs are working. °· Record and keep track of the peak flow meter's readings. °· Understand and use the asthma action plan. An asthma action plan is a written plan for taking care of your asthma and treating your attacks. °· To help prevent asthma attacks: °¨ Do not smoke. Stay away from secondhand smoke. °¨ Change your heating and air conditioning filter often. °¨ Limit your use of fireplaces and wood stoves. °¨ Get rid of pests (such as roaches and mice) and their droppings. °¨ Throw away plants if you see mold on them. °¨ Clean your floors. Dust regularly. Use cleaning products that do not smell. °¨ Have someone vacuum when you are not home. Use a vacuum cleaner with a HEPA filter if possible. °¨ Replace carpet with wood, tile, or vinyl flooring. Carpet can trap animal skin flakes and dust. °¨ Use allergy-proof pillows, mattress covers, and box spring covers. °¨ Wash bed sheets and blankets every week in hot water and dry them in a dryer. °¨ Use blankets that are made of polyester or cotton. °¨ Clean bathrooms and kitchens with bleach. If  possible, have someone repaint the walls in these rooms with mold-resistant paint. Keep out of the rooms that are being cleaned and painted. °¨ Wash hands often. °GET HELP IF: °· You have make a whistling sound when breaking (wheeze), have shortness of breath, or have a cough even if taking medicine to prevent attacks. °· The colored mucus you cough up (sputum) is thicker than usual. °· The colored mucus you cough up changes from clear or white to yellow, green, gray, or bloody. °· You have problems from the medicine you are taking such as: °¨ A rash. °¨ Itching. °¨ Swelling. °¨ Trouble breathing. °· You need reliever medicines more than 2-3 times a week. °· Your peak flow measurement is still at 50-79% of your personal best after following the action plan for 1 hour. °· You have a fever. °GET HELP RIGHT AWAY IF:  °· You seem to be worse and are not responding to medicine during an asthma attack. °· You are short of breath even at rest. °· You get short of breath when doing very little activity. °· You have trouble eating, drinking, or talking. °· You have chest pain. °· You have a fast heartbeat. °· Your lips or fingernails start to turn blue. °· You are light-headed, dizzy, or faint. °· Your peak flow is less than 50% of your personal best. °MAKE SURE YOU:  °· Understand these instructions. °· Will watch your condition. °· Will get help right away if you   are not doing well or get worse. Document Released: 04/23/2008 Document Revised: 03/22/2014 Document Reviewed: 06/04/2013 [facility] Patient Information 2015 [place], [place]. This information is not intended to replace advice given to you by your health care provider. Make sure you discuss any questions you have with your health care provider.  Use albuterol inhaler 2 puffs every 6 hours at least for the next few days. Then as needed. Return for any new or worse symptoms.

## 2014-10-23 NOTE — ED Notes (Signed)
Pt "feels better", alert, NAD, calm, interactive, resps e/u, speaking in clear complete sentences, no dyspnea noted, LS CTA, used inhaler PTA, c/o dizziness earlier, better now, states, "feel like dizziness was r/t breathing", mentions recent stomach bug, resolved on its own, (denies: fever, nvd, bleeding, congestion, pain). No meds taken other than albuterol.

## 2014-10-23 NOTE — ED Provider Notes (Signed)
CSN: [id number]     Arrival date & time 10/23/14  1846 History  This chart was scribed for Anne MuldersScott Amiere Cawley, MD by [place] Peace, ED Scribe. The patient was seen in MH06/MH06. The patient's care was started at 9:02 PM.     Chief Complaint  Patient presents with  . Dizziness      Patient is a 23 y.o. female presenting with dizziness. The history is provided by the patient. No language interpreter was used.  Dizziness Associated symptoms: chest pain, headaches, nausea and shortness of breath   Associated symptoms: no diarrhea and no vomiting     HPI Comments: Anne Flores is a 23 y.o. female who presents to the Emergency Department complaining of asthma attack. Pt states that she works at [employer] eating some food when she started experiencing dizziness, SOB and lightheadedness. After about a half hour of taking her inhaler, she states that she began to feel better but she was already here at ED. She notes that she does not usually her inhaler often, only when she is doing some physical activity which she was not involved in today. Father reports that son was just here for Thanksgiving and he was sick as well. Pt is non-smoker. History of asthma.    Past Medical History  Diagnosis Date  . Asthma   . Allergy   . Scoliosis    Past Surgical History  Procedure Laterality Date  . Wisdom tooth extraction     Family History  Problem Relation Age of Onset  . Migraines Mother   . Cancer Mother   . Stroke Maternal Grandmother   . Cancer Paternal Grandfather   . Stroke Paternal Uncle    History  Substance Use Topics  . Smoking status: Never Smoker   . Smokeless tobacco: Not on file  . Alcohol Use: No   OB History    No data available     Review of Systems  Constitutional: Positive for fever. Negative for chills.  HENT: Positive for rhinorrhea and sore throat.   Eyes: Negative for visual disturbance.  Respiratory: Positive for cough, shortness of breath and wheezing.    Cardiovascular: Positive for chest pain.  Gastrointestinal: Positive for nausea. Negative for vomiting, abdominal pain and diarrhea.  Genitourinary: Negative for dysuria.  Musculoskeletal: Negative for joint swelling and arthralgias.  Skin: Negative for rash.  Neurological: Positive for dizziness and headaches.  Hematological: Does not bruise/bleed easily.  Psychiatric/Behavioral: Negative for confusion.      Allergies  Ciprofloxacin and Pertussis vaccines  Home Medications   Prior to Admission medications   Medication Sig Start Date End Date Taking? Authorizing Provider  albuterol (PROVENTIL HFA;VENTOLIN HFA) 108 (90 BASE) MCG/ACT inhaler Inhale 2 puffs into the lungs every 6 (six) hours as needed for wheezing. 07/22/13   Thao P Le, DO  azithromycin (ZITHROMAX) 250 MG tablet Use as directed 08/02/14   Jonita Albeehris W Guest, MD  montelukast (SINGULAIR) 10 MG tablet Take 1 tablet (10 mg total) by mouth at bedtime. 07/28/14   Elvina SidleKurt Lauenstein, MD  Multiple Vitamins-Minerals (MULTIVITAMIN WITH MINERALS) tablet Take 1 tablet by mouth daily.    Historical Provider, MD   BP 129/64 mmHg  Pulse 89  Temp(Src) 98.4 F (36.9 C) (Oral)  Resp 16  Ht 5\' 4"  (1.626 m)  Wt 140 lb (63.504 kg)  BMI 24.02 kg/m2  SpO2 100%  LMP 10/09/2014 Physical Exam  Constitutional: She is oriented to person, place, and time. She appears well-developed and well-nourished. No  distress.  HENT:  Head: Normocephalic and atraumatic.  Mouth/Throat: Oropharynx is clear and moist and mucous membranes are normal.  Eyes: Conjunctivae and EOM are normal. Pupils are equal, round, and reactive to light. No scleral icterus.  Neck: Neck supple. No tracheal deviation present.  Cardiovascular: Normal rate, regular rhythm and normal heart sounds.   No murmur heard. Pulmonary/Chest: Effort normal and breath sounds normal. No respiratory distress. She has no wheezes.  Abdominal: Bowel sounds are normal. There is no tenderness.   Musculoskeletal: Normal range of motion.  Neurological: She is alert and oriented to person, place, and time. No cranial nerve deficit. She exhibits normal muscle tone. Coordination normal.  Skin: Skin is warm and dry.  Psychiatric: She has a normal mood and affect. Her behavior is normal.  Nursing note and vitals reviewed.   ED Course  Procedures (including critical care time) Labs Review Labs Reviewed - No data to display  Imaging Review No results found.   EKG Interpretation None     Medications  albuterol (PROVENTIL HFA;VENTOLIN HFA) 108 (90 BASE) MCG/ACT inhaler 2 puff (not administered)    9:08 PM- Treatment plan was discussed with patient who verbalizes understanding and agrees.   MDM   Final diagnoses:  Asthma, unspecified asthma severity, uncomplicated    Patients history on presentation sounds if there is an acute asthma exacerbation. She eventually found her albuterol inhaler which was expired and used it in about 30 minutes had resolution of the trouble breathing the tightness in her chest and the wheezing. Patient's had some mild viral type symptoms throughout the week. Possibly could be triggering this. There is no significant fever patient is nontoxic no acute distress. Oxygenation is normal. Lungs are now clear bilaterally. Patient will be given albuterol inhaler here and instructed to use it around-the-clock for the next few days and then as needed.  I personally performed the services described in this documentation, which was scribed in my presence. The recorded information has been reviewed and is accurate.     Anne MuldersScott Harles Evetts, MD 10/23/14 2124

## 2014-10-23 NOTE — ED Notes (Signed)
Pt reports suddenly around 1720 started feeling dizzy and short of breath.  Used inhaler and reports feeling better now.

## 2014-11-11 ENCOUNTER — Ambulatory Visit (INDEPENDENT_AMBULATORY_CARE_PROVIDER_SITE_OTHER): Payer: BC Managed Care – PPO

## 2014-11-11 ENCOUNTER — Ambulatory Visit (INDEPENDENT_AMBULATORY_CARE_PROVIDER_SITE_OTHER): Payer: BC Managed Care – PPO | Admitting: Family Medicine

## 2014-11-11 VITALS — BP 118/68 | HR 66 | Temp 98.6°F | Resp 16 | Ht 65.0 in | Wt 139.0 lb

## 2014-11-11 DIAGNOSIS — M25562 Pain in left knee: Secondary | ICD-10-CM

## 2014-11-11 DIAGNOSIS — S86912A Strain of unspecified muscle(s) and tendon(s) at lower leg level, left leg, initial encounter: Secondary | ICD-10-CM

## 2014-11-11 MED ORDER — DICLOFENAC SODIUM 75 MG PO TBEC: 75.0000 mg | DELAYED_RELEASE_TABLET | Freq: Two times a day (BID) | ORAL | Status: DC

## 2014-11-11 NOTE — Progress Notes (Signed)
Subjective: Patient has been hurting in her left knee for a few days. She stepped about a foot and half off of a porch today and had sudden worse pain. It hurts right below mid part of the patella.  Objective: Good range of motion of knee. No lateral ligamentous weakness. Patella is a little tender to percussion. No effusion. On deeper palpation around the patella is a little tender. The patella tendon does not seem terribly tender.  [facility] reading (PRIMARY) by  Dr. Alwyn RenHopper Normal knee  Assessment: Patellar pain left knee Knee strain  Plan: Diclofenac Leg immobilizer If pain persists we will need further assessment and/or physical therapy or orthopedic referral.  .

## 2014-11-11 NOTE — Patient Instructions (Signed)
Take diclofenac one twice daily  Apply ice for about 15 or 20 minutes for 4-5 times daily  Wear the knee immobilizer for 5-7 days depending on how the knee is doing  If you continue to have a lot of knee pain please return or see an orthopedic doctor  Return at any time if abruptly worse

## 2014-12-24 ENCOUNTER — Ambulatory Visit (INDEPENDENT_AMBULATORY_CARE_PROVIDER_SITE_OTHER): Payer: BLUE CROSS/BLUE SHIELD | Admitting: Family Medicine

## 2014-12-24 VITALS — BP 104/50 | HR 90 | Temp 98.8°F | Resp 16 | Ht 63.0 in | Wt 140.2 lb

## 2014-12-24 DIAGNOSIS — J029 Acute pharyngitis, unspecified: Secondary | ICD-10-CM

## 2014-12-24 MED ORDER — AMOXICILLIN 875 MG PO TABS: 875.0000 mg | ORAL_TABLET | Freq: Two times a day (BID) | ORAL | Status: DC

## 2014-12-24 NOTE — Progress Notes (Signed)
° °  Subjective:    Patient ID: Anne LovettMegan Lynn Flores, female    DOB: December 26, 1990, 24 y.o.   MRN: [id number] This chart was scribed for Elvina SidleKurt Lauenstein, MD by Littie Deedsichard Sun, Medical Scribe. This patient was seen in Room 9 and the patient's care was started at 3:07 PM.   HPI HPI Comments: Anne MilletMegan Zoe LanLynn Flores is a 24 y.o. female who presents to the [facility] complaining of gradual onset URI symptoms that started a few days ago. Patient reports having sore throat and mild cough. She denies fever. 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 Patient has allergies to Cipro and Pertussis.  Patient has 3 jobs. 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 She notes that the business has been slow this winter. Her 23 year old mother is having a lumpectomy in 3 days; she has gone through 4 months of chemotherapy already and will likely have radiation after the surgery.  Review of Systems  Constitutional: Negative for fever.  HENT: Positive for sore throat.   Respiratory: Positive for cough.        Objective:   Physical Exam CONSTITUTIONAL: Well developed/well nourished HEAD: Normocephalic/atraumatic EYES: EOM/PERRL ENMT: Mucous membranes moist. Throat is reddened in the posterior aspect, both sides. NECK: supple no meningeal signs SPINE: entire spine nontender CV: S1/S2 noted, no murmurs/rubs/gallops noted LUNGS: Lungs are clear to auscultation bilaterally, no apparent distress ABDOMEN: soft, nontender, no rebound or guarding GU: no cva tenderness NEURO: Pt is awake/alert, moves all extremitiesx4 EXTREMITIES: pulses normal, full ROM SKIN: warm, color normal PSYCH: no abnormalities of mood noted        Assessment & Plan:   This chart was scribed in my presence and reviewed by me personally.    ICD-9-CM ICD-10-CM   1. Acute pharyngitis, unspecified pharyngitis type 462 J02.9 amoxicillin  (AMOXIL) 875 MG tablet     Culture, Group A Strep     Signed, Elvina SidleKurt Lauenstein, MD

## 2014-12-26 LAB — CULTURE, GROUP A STREP: Organism ID, Bacteria: NORMAL

## 2015-01-18 ENCOUNTER — Telehealth: Payer: Self-pay

## 2015-01-18 ENCOUNTER — Ambulatory Visit (INDEPENDENT_AMBULATORY_CARE_PROVIDER_SITE_OTHER): Payer: BLUE CROSS/BLUE SHIELD | Admitting: Internal Medicine

## 2015-01-18 VITALS — BP 110/62 | HR 91 | Temp 98.0°F | Resp 18 | Ht 65.5 in | Wt 141.0 lb

## 2015-01-18 DIAGNOSIS — R590 Localized enlarged lymph nodes: Secondary | ICD-10-CM | POA: Diagnosis not present

## 2015-01-18 LAB — POCT CBC
Granulocyte percent: 62.2 %G (ref 37–80)
HEMATOCRIT: 40.2 % (ref 37.7–47.9)
HEMOGLOBIN: 13.2 g/dL (ref 12.2–16.2)
LYMPH, POC: 2.1 (ref 0.6–3.4)
MCH: 30.5 pg (ref 27–31.2)
MCHC: 32.8 g/dL (ref 31.8–35.4)
MCV: 93 fL (ref 80–97)
MID (cbc): 0.5 (ref 0–0.9)
MPV: 7.7 fL (ref 0–99.8)
POC Granulocyte: 4.3 (ref 2–6.9)
POC LYMPH PERCENT: 30.4 %L (ref 10–50)
POC MID %: 7.4 %M (ref 0–12)
Platelet Count, POC: 214 10*3/uL (ref 142–424)
RBC: 4.33 M/uL (ref 4.04–5.48)
RDW, POC: 12.6 %
WBC: 6.9 10*3/uL (ref 4.6–10.2)

## 2015-01-18 LAB — POCT SEDIMENTATION RATE: POCT SED RATE: 27 mm/hr — AB (ref 0–22)

## 2015-01-18 MED ORDER — DOXYCYCLINE HYCLATE 100 MG PO TABS: 100.0000 mg | ORAL_TABLET | Freq: Two times a day (BID) | ORAL | Status: DC

## 2015-01-18 NOTE — Patient Instructions (Signed)
Cat Scratch Disease Cats often injure people by scratching or biting. This site of injury can become infected with a particular germ or bacteria present in the mouth of or on the cat. This germ is called Bartonella henselae. This infection is identified by the common name cat scratch disease (CSD).  SYMPTOMS  A red and sore pimple or bump, with or without pus, on the skin where the cat scratched or bit. The pimple or sore may be present for as long as three weeks after the scratch or bite occurred.  One or more enlarged lymph glands located toward the center of the body from where the injury occurred.  Less common symptoms include low-grade fever, tiredness, fatigue, headache and/or sore throat. DIAGNOSIS  The diagnosis is typically made by your caregiver who notes the history of a scratch or bite from a cat, and finds the skin sore and swollen lymph glands in the described area.  Culture of any drainage or pus from the injury site, or a needle aspiration or piece of tissue (biopsy) from a swollen lymph gland may also be done to confirm the diagnosis and assure that a different infection or disease is not causing your illness. Rare but serious complications may occur, they include:  Parinaud's syndrome - fever, swollen lymph glands and inflammation of the eye (conjunctivitis).  Infection of the brain (encephalitis).  Infection of the nerve of the eye (neuroretinitis).  Infection of the bone (osteomyelitis). TREATMENT  Usually treatment is not necessary or helpful, especially if you have a normal immune system. When infection is very severe, it may be treated with a medicine that kills the bacteria (antibiotic).  People with immune system problems (such as having AIDS or an organ transplant, or being on steroids or other immune modifying drugs) should be treated with antibiotics. HOME CARE INSTRUCTIONS   Avoid injury while playing with cats.  Wash well after playing with cats.  Do  not let your cat lick sores on your body.  Do not let your cat roam around outside of your house.  Keep the area of the cat scratch clean. Wash it with soap and water or apply an antiseptic solution such as povidone iodine.  You should get a tetanus shot if you have not had one in the past 5 or 10 years. If you receive one, your arm may get swollen and red and warm to the touch at the shot site. This is a common response to the medication in the shot. If you did not receive a tetanus shot here because you did not recall when your last one was given, make sure to check with your caregiver's office and determine if one is needed. Generally, for a "dirty" wound, you should receive a tetanus booster if you have not had one in the last five years. If you have a "clean" wound, you should receive a tetanus booster if you have not had one in the last ten years. SEEK IMMEDIATE MEDICAL CARE IF:   You have worsening signs of infection, such as more redness, increased pain, red streaking or pus coming from the wound, or warmth or swelling around the area of the scratch.  You develop worsening swollen lymph glands.  You develop abdominal pain, have problems with your vision or develop a skin rash.  You have a fever.  You become more tired or dizzy, or have a worsening headache.  You develop inflammation of your eye or have increasing vision problems.  You have pain in   one of your bones.  You develop a stiff neck.  You pass out. MAKE SURE YOU:   Understand these instructions.  Will watch your condition.  Will get help right away if you are not doing well or get worse. Document Released: 11/02/2000 Document Revised: 01/28/2012 Document Reviewed: 12/15/2008 ExitCare Patient Information 2015 [employer]. This information is not intended to replace advice given to you by your health care provider. Make sure you discuss any questions you have with your health care provider.  

## 2015-01-18 NOTE — Progress Notes (Signed)
Subjective:    Patient ID: Anne Flores, female    DOB: [birth year], 24 y.o.   MRN: [id number]  HPI Note scribed by Felix Ahmadi  24 year old pt here for marble sized mass on left side of her neck. Pt first noticed it about two years ago. She started to worry about the mass after her mother was diagnosed with breast cancer this last summer. No pain. No pain to touch. No pain with head movement. Pt has not noticed any other lumps on her body; has not noticed anything on her chest. Otherwise pt feels fine. No fatigue, weight loss, no loss of appetite. Pt has never had mononucleosis. The mass is the size of a green pea. Has new kitten for 2 weeks now. Three years ago pt was hospitalized for a sharp pain in her neck. Otherwise, pt has never been hospitalized for any other issues.      Edited by CWGMD  Review of Systems  Constitutional: Negative.   Musculoskeletal: Negative for neck pain and neck stiffness.  Skin: Negative.   Neurological: Negative.        Objective:   Physical Exam  Constitutional: She is oriented to person, place, and time. She appears well-developed and well-nourished.  HENT:  Head: Normocephalic and atraumatic.  Right Ear: External ear normal.  Left Ear: External ear normal.  Nose: Nose normal.  Mouth/Throat: Oropharynx is clear and moist.  Eyes: Conjunctivae are normal. Pupils are equal, round, and reactive to light.  Neck: Normal range of motion. Neck supple. No tracheal deviation present. No thyromegaly present.  Left cervical lymph node  Cardiovascular: Normal rate, regular rhythm and normal heart sounds.   Pulmonary/Chest: Effort normal.  Abdominal: She exhibits no mass. There is no tenderness.  Musculoskeletal: Normal range of motion.  Lymphadenopathy:       Head (right side): No submental, no submandibular, no tonsillar, no preauricular, no posterior auricular and no occipital adenopathy present.       Head (left side): No tonsillar, no  preauricular, no posterior auricular and no occipital adenopathy present.    She has cervical adenopathy.       Right cervical: No superficial cervical, no deep cervical and no posterior cervical adenopathy present.      Left cervical: Posterior cervical adenopathy present. No superficial cervical and no deep cervical adenopathy present.    She has no axillary adenopathy.       Right: No inguinal, no supraclavicular and no epitrochlear adenopathy present.       Left: No inguinal, no supraclavicular and no epitrochlear adenopathy present.  Neurological: She is alert and oriented to person, place, and time. She exhibits normal muscle tone. Coordination normal.  Skin: Skin is warm and dry.  Psychiatric: She has a normal mood and affect. Her behavior is normal. Thought content normal.  Only one node which is mobile benighn feeling   Results for orders placed or performed in visit on 01/18/15  POCT CBC  Result Value Ref Range   WBC 6.9 4.6 - 10.2 K/uL   Lymph, poc 2.1 0.6 - 3.4   POC LYMPH PERCENT 30.4 10 - 50 %L   MID (cbc) 0.5 0 - 0.9   POC MID % 7.4 0 - 12 %M   POC Granulocyte 4.3 2 - 6.9   Granulocyte percent 62.2 37 - 80 %G   RBC 4.33 4.04 - 5.48 M/uL   Hemoglobin 13.2 12.2 - 16.2 g/dL   HCT, POC 40.9 81.1 - 47.9 %  MCV 93.0 80 - 97 fL   MCH, POC 30.5 27 - 31.2 pg   MCHC 32.8 31.8 - 35.4 g/dL   RDW, POC 16.112.6 %   Platelet Count, POC 214 142 - 424 K/uL   MPV 7.7 0 - 99.8 fL        Assessment & Plan:  Cervical lymph node/single benign Doxycycline 100mg  bid/Uses BC

## 2015-01-18 NOTE — Telephone Encounter (Signed)
Patient was wondering if we could use the blood we drew to conduct a mono test.  Please call  [phone]

## 2015-01-18 NOTE — Telephone Encounter (Signed)
The blood was drawn today, we may have a chance. If so, can I add?

## 2015-01-19 NOTE — Telephone Encounter (Signed)
Ok add ebvirus panel

## 2015-01-19 NOTE — Telephone Encounter (Signed)
Called pt, she was not home. I checked with the lab and I am unable to add this test. I could not add this  Without a doctor's order, by the time Dr. Perrin MalteseGuest responded the blood was thrown out. If she would like to come back in for just a blood draw, we can do that without her seeing a doctor.

## 2015-01-20 ENCOUNTER — Other Ambulatory Visit: Payer: Self-pay | Admitting: *Deleted

## 2015-01-20 ENCOUNTER — Other Ambulatory Visit: Payer: BLUE CROSS/BLUE SHIELD | Admitting: Radiology

## 2015-01-20 DIAGNOSIS — R590 Localized enlarged lymph nodes: Secondary | ICD-10-CM

## 2015-01-20 NOTE — Progress Notes (Signed)
Pt here for labs only. 

## 2015-01-21 LAB — EPSTEIN-BARR VIRUS VCA ANTIBODY PANEL
EBV NA IgG: 462 U/mL — ABNORMAL HIGH (ref <18.0)
EBV VCA IgG: 138 U/mL — ABNORMAL HIGH (ref <18.0)

## 2015-01-28 ENCOUNTER — Encounter: Payer: Self-pay | Admitting: Family Medicine

## 2015-02-18 ENCOUNTER — Ambulatory Visit (INDEPENDENT_AMBULATORY_CARE_PROVIDER_SITE_OTHER): Payer: BLUE CROSS/BLUE SHIELD | Admitting: Physician Assistant

## 2015-02-18 VITALS — BP 102/60 | HR 62 | Temp 98.2°F | Resp 16 | Ht 64.0 in | Wt 139.2 lb

## 2015-02-18 DIAGNOSIS — L259 Unspecified contact dermatitis, unspecified cause: Secondary | ICD-10-CM | POA: Diagnosis not present

## 2015-02-18 NOTE — Progress Notes (Signed)
Subjective:    Patient ID: Anne Flores, female    DOB: 1991/11/04, 24 y.o.   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 Most of contact was with bows, arrows (which have feathers), dust, and arrow container. Rash has not spread and is red. Does not itch and is only mildly painful when pressure is applied. Denies SOB, swelling of arm, or fever. Washed arm off, but has not put any medication on. Med allergy to cipro and pertussis vaccine.    Review of Systems  Constitutional: Negative for fever.  Respiratory: Negative for shortness of breath and wheezing.   Skin: Positive for rash.       Objective:   Physical Exam  Constitutional: She is oriented to person, place, and time. She appears well-developed and well-nourished. No distress.  Blood pressure 102/60, pulse 62, temperature 98.2 F (36.8 C), temperature source Oral, resp. rate 16, height 5\' 4"  (1.626 m), weight 139 lb 3.2 oz (63.141 kg), last menstrual period 02/05/2015, SpO2 100 %.   HENT:  Head: Normocephalic and atraumatic.  Right Ear: External ear normal.  Left Ear: External ear normal.  Eyes: Conjunctivae are normal. Right eye exhibits no discharge. Left eye exhibits no discharge. No scleral icterus.  Neck: Neck supple.  Cardiovascular: Normal rate, regular rhythm and normal heart sounds.  Exam reveals no gallop and no friction rub.   No murmur heard. Pulmonary/Chest: Effort normal and breath sounds normal. No respiratory distress. She has no wheezes. She has no rales.  Musculoskeletal: She exhibits no edema.  Lymphadenopathy:    She has no cervical adenopathy.  Neurological: She is alert and oriented to person, place, and time.  Skin: Skin is warm and dry. Rash (contact dermatitis) noted. She is not diaphoretic. No erythema. No pallor.       Assessment & Plan:  1. Contact dermatitis Patient would prefer OTC  tx. Can use cortisone cream. Benadryl can be used if begins to itch. RTC if arm becomes swollen, SOB, or wheezing.    Janan Ridge PA-C  [facility] 02/18/2015 5:56 PM

## 2015-02-18 NOTE — Patient Instructions (Signed)

## 2015-06-19 ENCOUNTER — Ambulatory Visit (INDEPENDENT_AMBULATORY_CARE_PROVIDER_SITE_OTHER): Payer: BLUE CROSS/BLUE SHIELD | Admitting: Family Medicine

## 2015-06-19 VITALS — BP 110/70 | HR 76 | Temp 98.2°F | Resp 18 | Ht 65.0 in | Wt 135.8 lb

## 2015-06-19 DIAGNOSIS — J4521 Mild intermittent asthma with (acute) exacerbation: Secondary | ICD-10-CM | POA: Diagnosis not present

## 2015-06-19 DIAGNOSIS — J0100 Acute maxillary sinusitis, unspecified: Secondary | ICD-10-CM | POA: Diagnosis not present

## 2015-06-19 MED ORDER — ALBUTEROL SULFATE HFA 108 (90 BASE) MCG/ACT IN AERS: 2.0000 | INHALATION_SPRAY | Freq: Four times a day (QID) | RESPIRATORY_TRACT | Status: DC | PRN

## 2015-06-19 MED ORDER — AMOXICILLIN-POT CLAVULANATE 875-125 MG PO TABS: 1.0000 | ORAL_TABLET | Freq: Two times a day (BID) | ORAL | Status: DC

## 2015-06-19 MED ORDER — LORATADINE-PSEUDOEPHEDRINE ER 5-120 MG PO TB12: 1.0000 | ORAL_TABLET | Freq: Two times a day (BID) | ORAL | Status: DC

## 2015-06-19 NOTE — Patient Instructions (Addendum)
1.  Increase Albuterol to 2 puffs four times daily.   Asthma, Acute Bronchospasm Acute bronchospasm caused by asthma is also referred to as an asthma attack. Bronchospasm means your air passages become narrowed. The narrowing is caused by inflammation and tightening of the muscles in the air tubes (bronchi) in your lungs. This can make it hard to breathe or cause you to wheeze and cough. CAUSES Possible triggers are:  Animal dander from the skin, hair, or feathers of animals.  Dust mites contained in house dust.  Cockroaches.  Pollen from trees or grass.  Mold.  Cigarette or tobacco smoke.  Air pollutants such as dust, household cleaners, hair sprays, aerosol sprays, paint fumes, strong chemicals, or strong odors.  Cold air or weather changes. Cold air may trigger inflammation. Winds increase molds and pollens in the air.  Strong emotions such as crying or laughing hard.  Stress.  Certain medicines such as aspirin or beta-blockers.  Sulfites in foods and drinks, such as dried fruits and wine.  Infections or inflammatory conditions, such as a flu, cold, or inflammation of the nasal membranes (rhinitis).  Gastroesophageal reflux disease (GERD). GERD is a condition where stomach acid backs up into your esophagus.  Exercise or strenuous activity. SIGNS AND SYMPTOMS   Wheezing.  Excessive coughing, particularly at night.  Chest tightness.  Shortness of breath. DIAGNOSIS  Your health care provider will ask you about your medical history and perform a physical exam. A chest X-ray or blood testing may be performed to look for other causes of your symptoms or other conditions that may have triggered your asthma attack. TREATMENT  Treatment is aimed at reducing inflammation and opening up the airways in your lungs. Most asthma attacks are treated with inhaled medicines. These include quick relief or rescue medicines (such as bronchodilators) and controller medicines (such as  inhaled corticosteroids). These medicines are sometimes given through an inhaler or a nebulizer. Systemic steroid medicine taken by mouth or given through an IV tube also can be used to reduce the inflammation when an attack is moderate or severe. Antibiotic medicines are only used if a bacterial infection is present.  HOME CARE INSTRUCTIONS   Rest.  Drink plenty of liquids. This helps the mucus to remain thin and be easily coughed up. Only use caffeine in moderation and do not use alcohol until you have recovered from your illness.  Do not smoke. Avoid being exposed to secondhand smoke.  You play a critical role in keeping yourself in good health. Avoid exposure to things that cause you to wheeze or to have breathing problems.  Keep your medicines up-to-date and available. Carefully follow your health care provider's treatment plan.  Take your medicine exactly as prescribed.  When pollen or pollution is bad, keep windows closed and use an air conditioner or go to places with air conditioning.  Asthma requires careful medical care. See your health care provider for a follow-up as advised. If you are more than [redacted] weeks pregnant and you were prescribed any new medicines, let your obstetrician know about the visit and how you are doing. Follow up with your health care provider as directed.  After you have recovered from your asthma attack, make an appointment with your outpatient doctor to talk about ways to reduce the likelihood of future attacks. If you do not have a doctor who manages your asthma, make an appointment with a primary care doctor to discuss your asthma. SEEK IMMEDIATE MEDICAL CARE IF:   You are  getting worse.  You have trouble breathing. If severe, call your local emergency services (911 in the [place]  You develop chest pain or discomfort.  You are vomiting.  You are not able to keep fluids down.  You are coughing up yellow, green, brown, or bloody sputum.  You have a  fever and your symptoms suddenly get worse.  You have trouble swallowing. MAKE SURE YOU:   Understand these instructions.  Will watch your condition.  Will get help right away if you are not doing well or get worse. Document Released: 02/20/2007 Document Revised: 11/10/2013 Document Reviewed: 05/13/2013 [facility] Patient Information 2015 [place], [place]. This information is not intended to replace advice given to you by your health care provider. Make sure you discuss any questions you have with your health care provider.

## 2015-06-19 NOTE — Progress Notes (Signed)
Subjective:    Patient ID: Anne Flores, female    DOB: May 05, 1991, 24 y.o.   MRN: [id number] This chart was scribed for Nilda Simmer, MD by Littie Deeds, Medical Scribe. This patient was seen in Room 14 and the patient's care was started at 11:49 AM.   06/19/2015  Cough and Chest Pain   HPI  HPI Comments: Anne Flores is a 24 y.o. female with a history of asthma who presents to the [facility] complaining of a gradual onset productive cough of yellow sputum that started about a week ago. Patient reports having associated chest tightness, SOB, and rhinorrhea of thick yellow sputum. She did have a low grade fever 1 week ago. She has had intermittent headaches, but she has been working at [employer] for the past 2 months and thinks this was related to being dehydrated at times. Her boss was diagnosed with bronchitis and pneumonia 1 week ago. Patient denies chills, diaphoresis, sore throat, ear pain, sinus pressure, vomiting, and diarrhea; however, she has been dry heaving. She has a history of primarily exercise-induced asthma. She has been using her albuterol inhaler once every other day, but is not on any daily medications for asthma. She has not been hospitalized for asthma. Patient denies any recent antibiotics and states she is able to tolerate Augmentin fine.   She also has a history of scoliosis.  Review of Systems  Constitutional: Positive for fever. Negative for chills, diaphoresis and fatigue.  HENT: Positive for congestion, postnasal drip and rhinorrhea. Negative for ear pain, sinus pressure, sore throat, trouble swallowing and voice change.   Respiratory: Positive for cough, chest tightness, shortness of breath and wheezing.   Gastrointestinal: Negative for vomiting, abdominal pain and diarrhea.  Skin: Negative for rash.  Neurological: Positive for headaches.    Past Medical History  Diagnosis Date  . Asthma   . Allergy   . Scoliosis     Past Surgical History  Procedure Laterality Date  . Wisdom tooth extraction     Allergies  Allergen Reactions  . Ciprofloxacin   . Pertussis Vaccines    History   Social History  . Marital Status: Single    Spouse Name: N/A  . Number of Children: N/A  . Years of Education: N/A   Occupational History  . Not on file.   Social History Main Topics  . Smoking status: Never Smoker   . Smokeless tobacco: Never Used  . Alcohol Use: No  . Drug Use: No  . Sexual Activity: No   Other Topics Concern  . Not on file   Social History Narrative        Objective:    BP 110/70 mmHg  Pulse 76  Temp(Src) 98.2 F (36.8 C) (Oral)  Resp 18  Ht 5\' 5"  (1.651 m)  Wt 135 lb 12.8 oz (61.598 kg)  BMI 22.60 kg/m2  SpO2 98%  LMP 06/03/2015 (Exact Date) Physical Exam  Constitutional: She is oriented to person, place, and time. She appears well-developed and well-nourished. No distress.  HENT:  Head: Normocephalic and atraumatic.  Right Ear: External ear normal.  Left Ear: External ear normal.  Nose: Nose normal.  Mouth/Throat: Oropharynx is clear and moist. No oropharyngeal exudate.  B cerumen impaction; TMs not visualized  Eyes: Conjunctivae are normal. Pupils are equal, round, and reactive to light.  Neck: Neck supple.  Cardiovascular: Normal rate, regular rhythm and normal heart sounds.   No murmur heard. Pulmonary/Chest: Effort  normal and breath sounds normal. No respiratory distress. She has no wheezes. She has no rales.  Clear to auscultation bilaterally.   Musculoskeletal: She exhibits no edema.  Lymphadenopathy:    She has no cervical adenopathy.  Neurological: She is alert and oriented to person, place, and time. No cranial nerve deficit.  Skin: Skin is warm and dry. No rash noted. She is not diaphoretic.  Psychiatric: She has a normal mood and affect. Her behavior is normal.  Vitals reviewed.  PEAK FLOW: 300, 315, 315  (PREDICTED 420).     Assessment & Plan:    1. Acute maxillary sinusitis, recurrence not specified   2. Asthma with acute exacerbation, mild intermittent   3. Asthma, mild intermittent, with acute exacerbation    -New. -Rx for Augmentin, Albuterol, Claritin D. -Pt to RTC for worsening SOB/wheezing/chest tightness. -Pt to call in 72 hours if wheezing not improved; will call in Prednisone.   Meds ordered this encounter  Medications  . amoxicillin-clavulanate (AUGMENTIN) 875-125 MG per tablet    Sig: Take 1 tablet by mouth 2 (two) times daily.    Dispense:  20 tablet    Refill:  0  . loratadine-pseudoephedrine (CLARITIN-D 12-HOUR) 5-120 MG per tablet    Sig: Take 1 tablet by mouth 2 (two) times daily.    Dispense:  20 tablet    Refill:  0  . albuterol (PROVENTIL HFA;VENTOLIN HFA) 108 (90 BASE) MCG/ACT inhaler    Sig: Inhale 2 puffs into the lungs every 6 (six) hours as needed for wheezing.    Dispense:  1 Inhaler    Refill:  1    No Follow-up on file.    I personally performed the services described in this documentation, which was scribed in my presence. The recorded information has been reviewed and considered.  Lilienne Weins Paulita Fujita, M.D. [facility] [address] [place], [place]  [address] [phone] phone [phone] fax

## 2015-09-20 ENCOUNTER — Ambulatory Visit (INDEPENDENT_AMBULATORY_CARE_PROVIDER_SITE_OTHER): Payer: BLUE CROSS/BLUE SHIELD

## 2015-09-20 ENCOUNTER — Ambulatory Visit (INDEPENDENT_AMBULATORY_CARE_PROVIDER_SITE_OTHER): Payer: BLUE CROSS/BLUE SHIELD | Admitting: Family Medicine

## 2015-09-20 VITALS — BP 108/60 | HR 83 | Temp 98.1°F | Resp 18 | Ht 65.0 in | Wt 146.0 lb

## 2015-09-20 DIAGNOSIS — M79644 Pain in right finger(s): Secondary | ICD-10-CM

## 2015-09-20 DIAGNOSIS — S63659A Sprain of metacarpophalangeal joint of unspecified finger, initial encounter: Secondary | ICD-10-CM | POA: Diagnosis not present

## 2015-09-20 NOTE — Patient Instructions (Addendum)
Wear thumb spica splint at almost all times except when showering or having to get hands wet - then try to be very gentle with the right thumb and do not strain joint at all.  Ulnar Collateral Ligament Injury of the Thumb A ligament is a strong band of tissue that connects and supports bones. Ulnar collateral ligament (UCL) injury happens when the UCL at the base of the thumb is stretched or torn. A tear can be either partial or complete. The severity of the injury depends on how much of the ligament was damaged or torn. The UCL ligament is important for normal use of the thumb. This ligament helps you to use and move your thumb. UCL injury can happen suddenly (acuteinjury) or gradually (chronic injury) with repeated overstretching of the ligament. If it is not treated properly, UCL injury can lead to arthritis. CAUSES This injury is caused by forcefully moving the thumb past its normal range of motion toward the wrist. If you extend your hands to catch an object or to protect yourself while falling, the force of the impact can cause your ligament to stretch too much. This excess tension can also cause your ligament to tear. RISK FACTORS: This injury is more likely to occur in:  People who have had a previous thumb injury or sprain.  People who play contact sports or sports that involve catching balls, such as baseball, basketball, or football.  People who do activities that increase the chance that the thumb will be pulled away from the rest of the hand.  People who have poor hand strength and flexibility.  People who do not warm up properly before activities. SYMPTOMS Symptoms of this injury include:  Pain or tenderness over the injured area with movement of the thumb.  Pain when the injured area is pressed.  Bruising or redness at the base of the thumb. This can spread to the whole thumb and part of the hand.  Swelling over the injured area.  Difficulty grasping or pinching with the  injured thumb due to weakness or pain. If the injury is severe, a lump (mass) may be felt under the skin in the injured area. DIAGNOSIS This injury is diagnosed with a medical history and physical exam. You may also have imaging studies, including:  X-ray.  Ultrasound.  MRI. TREATMENT Treatment varies depending on the severity of your injury. If the UCL is overstretched or partially torn, treatment usually involves keeping your thumb in a fixed position (immobilization) for a period of time. To help you do this, your health care provider will apply a brace, cast, or splint to keep your thumb from moving until it heals. If the UCL is fully torn, you may need surgery to reconnect the ligament to the bone. After surgery, a cast or splint will be applied and it will need to stay on your thumb while it heals. Your health care provider may also suggest exercises or physical therapy to strengthen your thumb. HOME CARE INSTRUCTIONS If You Have a Cast:  Do not stick anything inside the cast to scratch your skin. Doing that increases your risk of infection.  Check the skin around the cast every day. Report any concerns to your health care provider. You may put lotion on dry skin around the edges of the cast. Do not apply lotion to the skin underneath the cast.  Keep the cast clean and dry. If You Have a Splint or Brace:  Wear it as told by your health care  provider. Remove it only as told by your health care provider.  Loosen it if your fingers become numb and tingle, or if they turn cold and blue.  Keep the brace or splint clean and dry. Bathing  Cover the cast or splint and bandage (dressing) with a watertight plastic bag to protect it from water while you take a bath or shower. Do not let the cast or splint and dressing get wet. Managing Pain, Stiffness, and Swelling   If directed, apply ice to the injured area:  Put ice in a plastic bag.  Place a towel between your skin and the  bag.  Leave the ice on for 20 minutes, 2-3 times per day.  Move your fingers often to avoid stiffness and to lessen swelling.  Raise (elevate) the injured area above the level of your heart while you are sitting or lying down. Driving  Do not drive or operate heavy machinery while taking prescription pain medicine.  Ask your health care provider when it is safe to drive if you have a cast, splint, or brace on your hand. General Instructions  Do not put pressure on any part of your cast or splint until it is fully hardened. This may take several hours.  Take over-the-counter and prescription medicines only as told by your health care provider.  Keep all follow-up visits as told by your health care provider. This is important.  Do not wear rings on your injured thumb.  Do any exercise or physical therapy as told by your health care provider. SEEK MEDICAL CARE IF:   Your pain is not controlled with medicine.  Your bruising or swelling gets worse.  Your cast or splint is damaged.  Your thumb is numb or blue.  Your thumb feels colder than normal.   This information is not intended to replace advice given to you by your health care provider. Make sure you discuss any questions you have with your health care provider.   Document Released: 11/05/2005 Document Revised: 07/27/2015 Document Reviewed: 01/12/2015 Elsevier Interactive Patient Education [employer]

## 2015-09-20 NOTE — Progress Notes (Signed)
Subjective:  This chart was scribed for Norberto Sorenson MD, by Veverly Fells, at [facility]  This patient was seen in room 11 and the patient's care was started at 1:24 PM.    Patient ID: Anne Flores, female    DOB: 07/09/1991, 24 y.o.   MRN: [id number] Chief Complaint  Patient presents with  . Hand Pain    rt thumb x2 weeks    HPI  HPI Comments: Anne Flores is a 24 y.o. female who presents to the [facility] complaining of  sudden right thumb pain onset two weeks ago with an associated symptoms of a small raised area.  She denies any recent/prior injury and states that she feels like it is getting worse.  Gripping or holding something for a long time increases her pain.  She denies any wrist problems. Patient does not take part in any contact sports.  She rides a motorcycle but denies any recent injuries. Patient is right handed.  She has no other concerns or complaints today.   Past Medical History  Diagnosis Date  . Asthma   . Allergy   . Scoliosis     Current Outpatient Prescriptions on File Prior to Visit  Medication Sig Dispense Refill  . albuterol (PROVENTIL HFA;VENTOLIN HFA) 108 (90 BASE) MCG/ACT inhaler Inhale 2 puffs into the lungs every 6 (six) hours as needed for wheezing. 1 Inhaler 1  . Multiple Vitamins-Minerals (MULTIVITAMIN WITH MINERALS) tablet Take 1 tablet by mouth daily.    Marland Kitchen amoxicillin-clavulanate (AUGMENTIN) 875-125 MG per tablet Take 1 tablet by mouth 2 (two) times daily. (Patient not taking: Reported on 09/20/2015) 20 tablet 0  . loratadine-pseudoephedrine (CLARITIN-D 12-HOUR) 5-120 MG per tablet Take 1 tablet by mouth 2 (two) times daily. (Patient not taking: Reported on 09/20/2015) 20 tablet 0   No current facility-administered medications on file prior to visit.    Allergies  Allergen Reactions  . Ciprofloxacin   . Pertussis Vaccines      Review of Systems  Constitutional: Negative for fever and  chills.  Respiratory: Negative for cough and shortness of breath.   Gastrointestinal: Negative for nausea and vomiting.  Musculoskeletal: Negative for neck pain and neck stiffness.  Skin: Negative for color change.  Neurological: Negative for syncope and speech difficulty.       Objective:   Physical Exam  Constitutional: She is oriented to person, place, and time. She appears well-developed and well-nourished. No distress.  HENT:  Head: Normocephalic and atraumatic.  Eyes: Pupils are equal, round, and reactive to light.  Neck: Normal range of motion.  Pulmonary/Chest: No respiratory distress.  Musculoskeletal: Normal range of motion.  Bump on medial aspect of the first MCP.   Slight reduction in the range of motion with opposition.   Full range of motion with abduction and adduction.  laxity with extension and stressing of the ulnar collateral ligament.   Neurological: She is alert and oriented to person, place, and time.  Skin: Skin is warm and dry.  Psychiatric: She has a normal mood and affect. Her behavior is normal.   BP 108/60 mmHg  Pulse 83  Temp(Src) 98.1 F (36.7 C) (Oral)  Resp 18  Ht 5\' 5"  (1.651 m)  Wt 146 lb (66.225 kg)  BMI 24.30 kg/m2  SpO2 98%  LMP 09/06/2015   [facility] (PRIMARY) x-ray report read by Dr. Clelia Croft:  Right hand: the phalanx looks to be displaced slightly to the palmar aspect at  first MCP. Most noticeable on the oblique lateral views are normal.       Assessment & Plan:   1. Thumb pain, right   2. 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  Placed in thumb spica splint and refer to hand surg @ [place] [facility]  Orders Placed This Encounter  Procedures  . DG Hand Complete Right    Standing Status: Future     Number of Occurrences: 1     Standing Expiration Date: 09/19/2016    Order Specific Question:  Reason for Exam (SYMPTOM  OR DIAGNOSIS REQUIRED)    Answer:  1st MCP pain,  increased bony prominence of 1st MCP, and laxity with extensiion, no known injury    Order Specific Question:  Is the patient pregnant?    Answer:  No    Order Specific Question:  Preferred imaging location?    Answer:  External  . Ambulatory referral to Hand Surgery    Referral Priority:  Routine    Referral Type:  Surgical    Referral Reason:  Specialty Services Required    Requested Specialty:  Hand Surgery    Number of Visits Requested:  1    I personally performed the services described in this documentation, which was scribed in my presence. The recorded information has been reviewed and considered, and addended by me as needed.  Norberto Sorenson, MD MPH

## 2016-01-22 IMAGING — CR DG HAND COMPLETE 3+V*R*
4 series · 4 of 4 positions shown · non-contrast
Comparison: None in PACs

CLINICAL DATA: Right thumb pain with increased bony prominence
common no known injury.

EXAM:
RIGHT HAND - COMPLETE 3+ VIEW

[PA]
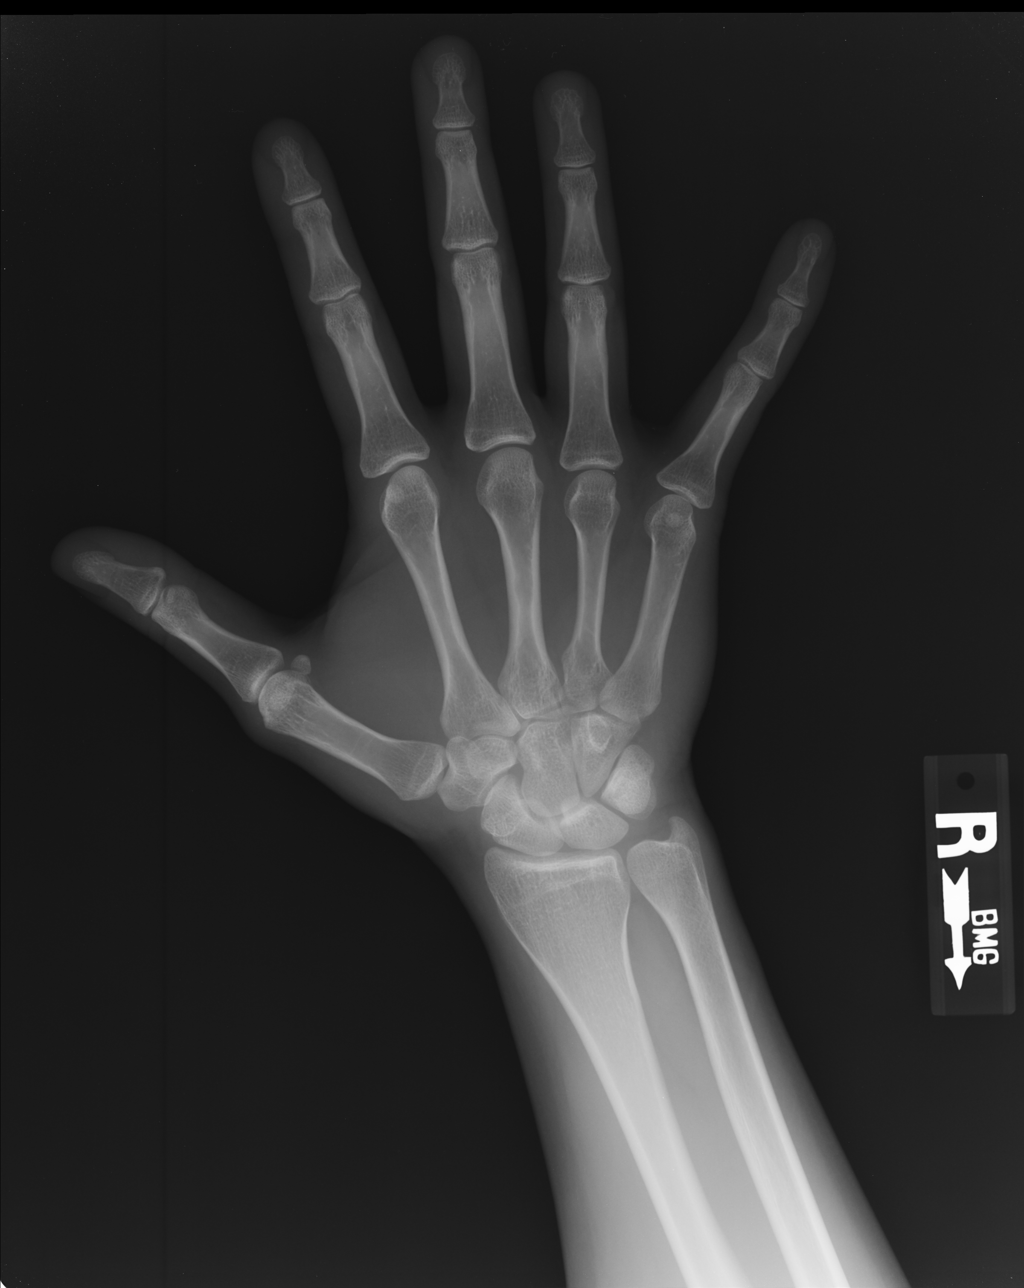

[pa obl]
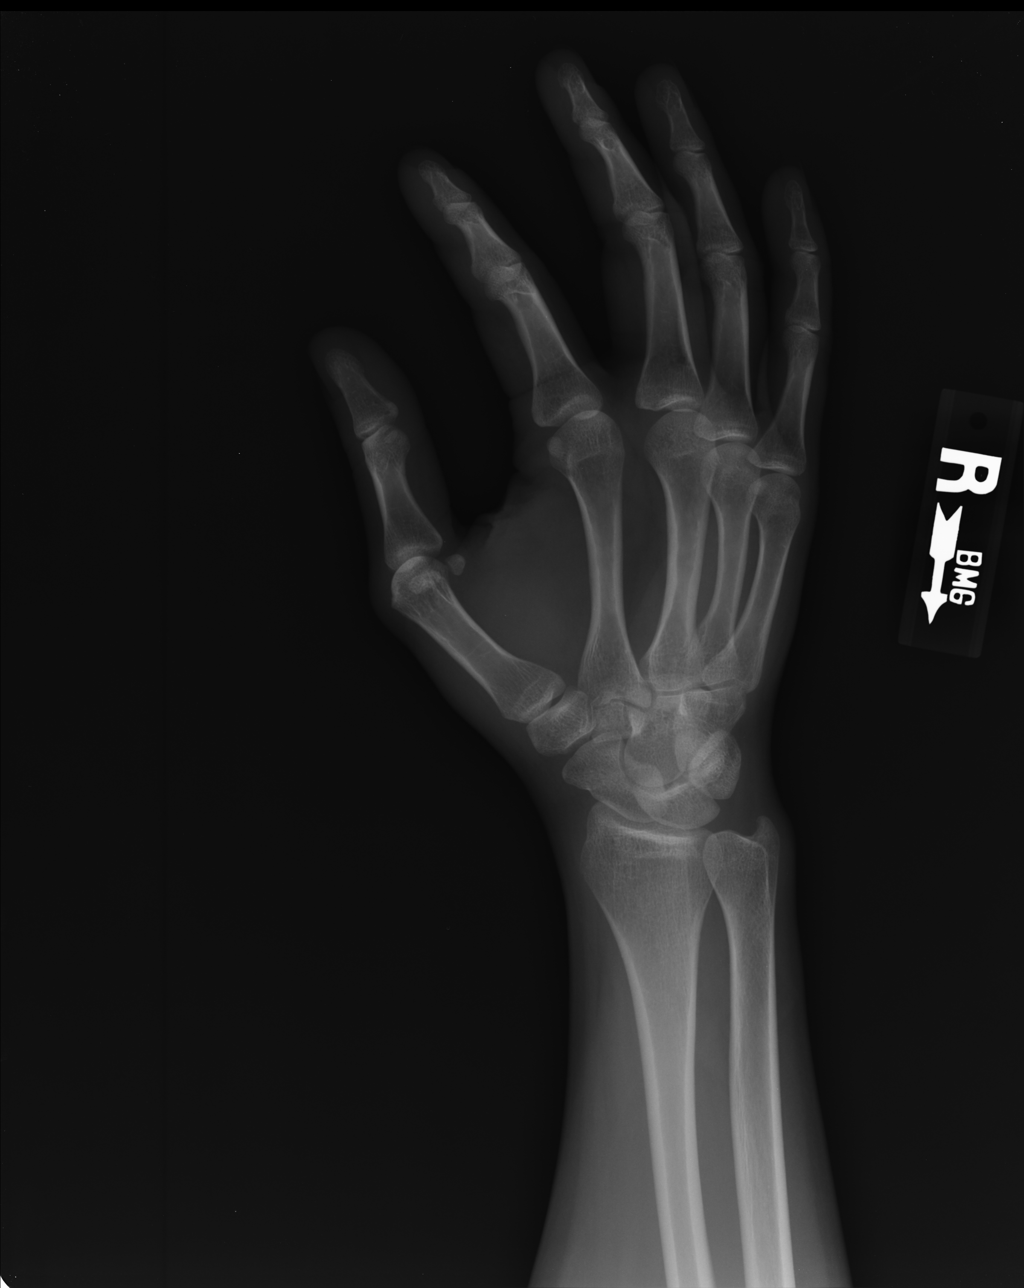

[lateral (1 of 2)]
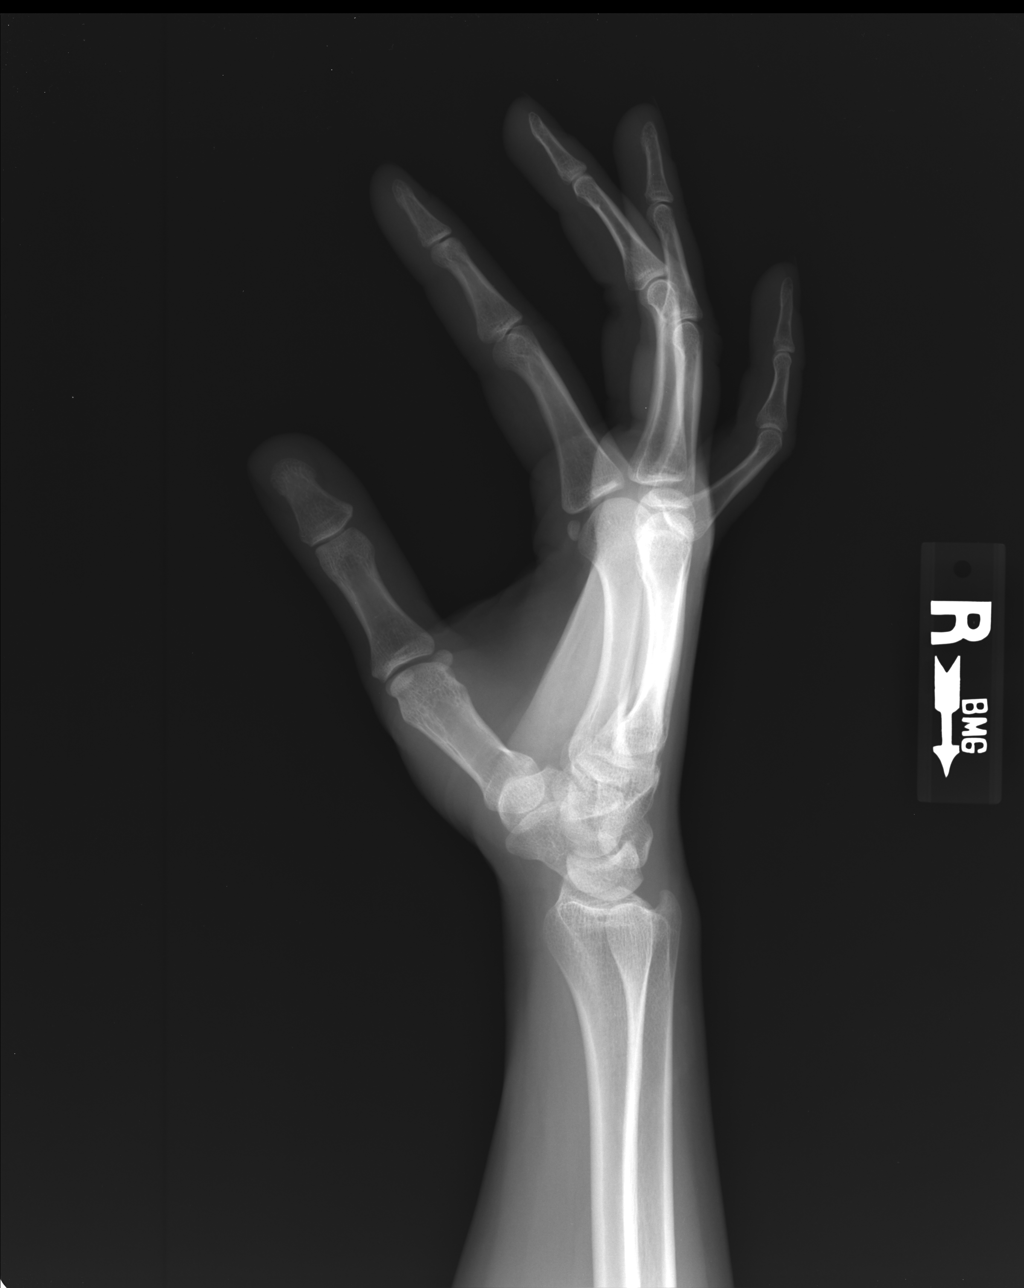

[lateral (2 of 2)]
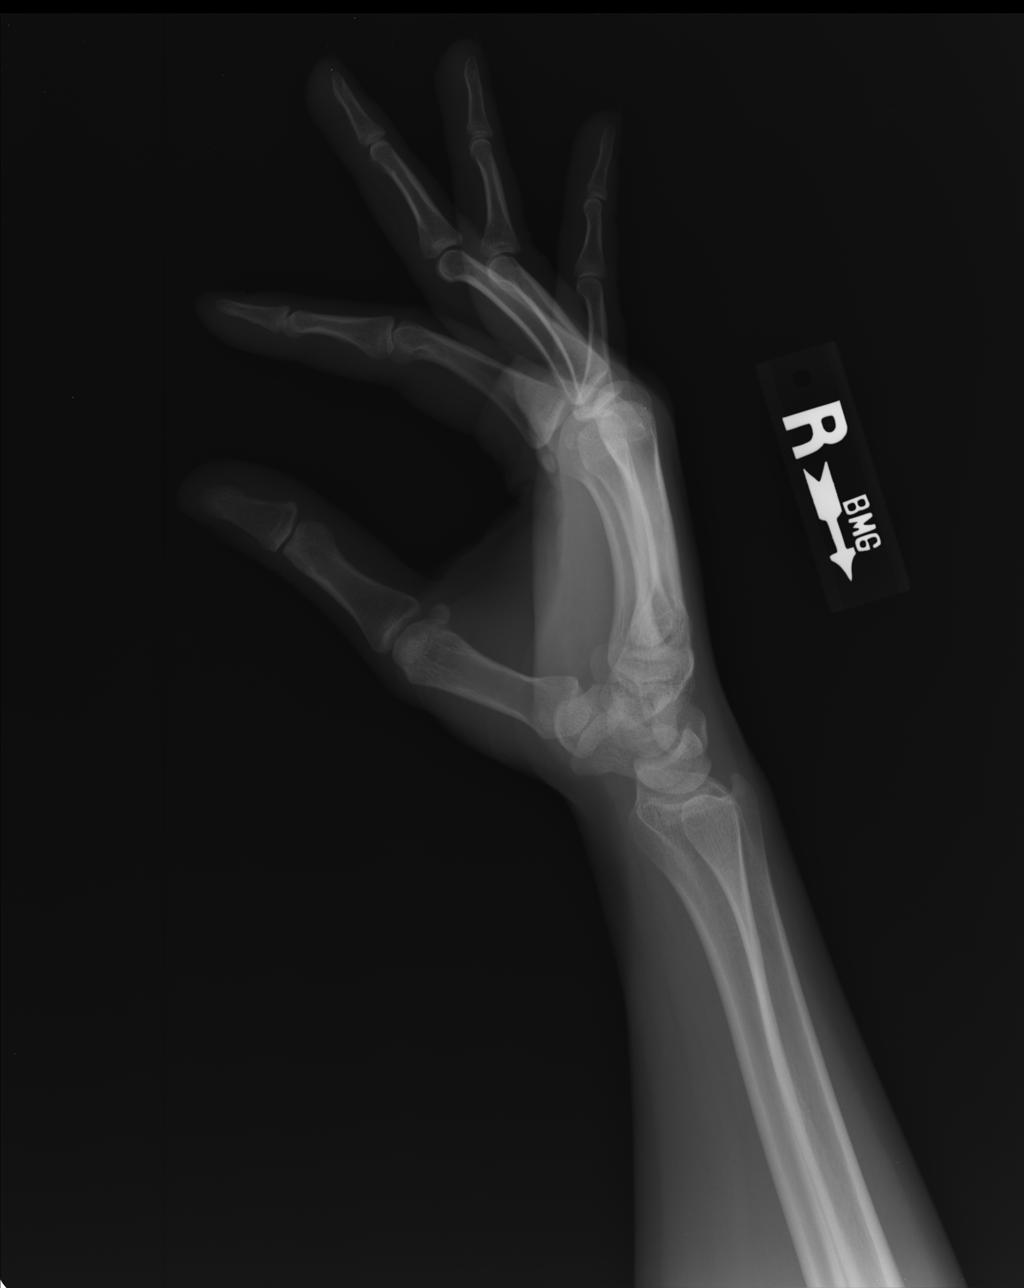

[4 of 4 positions shown; findings below may reference images not displayed]

FINDINGS: The bones of the right hand are adequately mineralized. There is no
acute fracture nor dislocation. The joint spaces are preserved.
Specific attention to the thumb reveals no acute bony abnormality.
The joint spaces are preserved. There is no significant degenerative
change.
IMPRESSION: There is no acute bony abnormality of the right thumb or remainder
of the right hand.

## 2021-03-23 ENCOUNTER — Other Ambulatory Visit: Payer: Self-pay

## 2021-03-23 ENCOUNTER — Ambulatory Visit (INDEPENDENT_AMBULATORY_CARE_PROVIDER_SITE_OTHER): Payer: No Typology Code available for payment source

## 2021-03-23 ENCOUNTER — Ambulatory Visit: Payer: No Typology Code available for payment source | Admitting: Family Medicine

## 2021-03-23 ENCOUNTER — Encounter: Payer: Self-pay | Admitting: Family Medicine

## 2021-03-23 VITALS — BP 106/70 | HR 84 | Temp 97.5°F | Resp 16 | Ht 64.0 in | Wt 139.0 lb

## 2021-03-23 DIAGNOSIS — D508 Other iron deficiency anemias: Secondary | ICD-10-CM | POA: Diagnosis not present

## 2021-03-23 DIAGNOSIS — Z23 Encounter for immunization: Secondary | ICD-10-CM | POA: Diagnosis not present

## 2021-03-23 DIAGNOSIS — N920 Excessive and frequent menstruation with regular cycle: Secondary | ICD-10-CM

## 2021-03-23 DIAGNOSIS — R5383 Other fatigue: Secondary | ICD-10-CM | POA: Diagnosis not present

## 2021-03-23 DIAGNOSIS — Z298 Encounter for other specified prophylactic measures: Secondary | ICD-10-CM

## 2021-03-23 DIAGNOSIS — Z8616 Personal history of COVID-19: Secondary | ICD-10-CM

## 2021-03-23 MED ORDER — ALBUTEROL SULFATE HFA 108 (90 BASE) MCG/ACT IN AERS: 2.0000 | INHALATION_SPRAY | Freq: Four times a day (QID) | RESPIRATORY_TRACT | 2 refills | Status: AC | PRN

## 2021-03-23 NOTE — Progress Notes (Signed)
New Patient Office Visit  Subjective:  Patient ID: Anne Flores, female    DOB: Jan 23, 1991  Age: 30 y.o. MRN: [id number]  CC:  Chief Complaint  Patient presents with  . Establish Care    HPI Anne Flores presents to establish care. 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  Pt needs vaccinations and is here to determine what else is recommended.  Past Medical History:  Diagnosis Date  . Allergy   . Asthma   . Scoliosis     Past Surgical History:  Procedure Laterality Date  . DENTAL SURGERY    . WISDOM TOOTH EXTRACTION      Family History  Problem Relation Age of Onset  . Migraines Mother   . Breast cancer Mother   . Stroke Maternal Grandmother   . Cancer Paternal Grandmother   . Stroke Paternal Uncle     Social History   Socioeconomic History  . Marital status: Single    Spouse name: Not on file  . Number of children: Not on file  . Years of education: Not on file  . Highest education level: Not on file  Occupational History  . Not on file  Tobacco Use  . Smoking status: Never Smoker  . Smokeless tobacco: Never Used  Substance and Sexual Activity  . Alcohol use: No    Alcohol/week: 0.0 standard drinks  . Drug use: No  . Sexual activity: Never    Birth control/protection: Abstinence  Other Topics Concern  . Not on file  Social History Narrative  . Not on file   Social Determinants of Health   Financial Resource Strain: Not on file  Food Insecurity: Not on file  Transportation Needs: Not on file  Physical Activity: Not on file  Stress: Not on file  Social Connections: Not on file  Intimate Partner Violence: Not on file    ROS Review of Systems  Constitutional: Positive for fatigue. Negative for chills and fever.  HENT: Negative for congestion, ear pain and sore throat.   Respiratory: Negative for cough and shortness of breath.   Cardiovascular: Negative for chest pain.  Gastrointestinal: Negative for  abdominal pain, constipation, diarrhea, nausea and vomiting.  Genitourinary: Negative for dysuria and urgency.       Menorrhagia with menses.  Musculoskeletal: Negative for arthralgias and myalgias.  Skin: Negative for rash.  Neurological: Negative for dizziness and headaches.  Psychiatric/Behavioral: Negative for dysphoric mood. The patient is not nervous/anxious.     Objective:   Today's Vitals: BP 106/70   Pulse 84   Temp (!) 97.5 F (36.4 C)   Resp 16   Ht 5\' 4"  (1.626 m)   Wt 139 lb (63 kg)   BMI 23.86 kg/m   Physical Exam Vitals reviewed.  Constitutional:      General: She is not in acute distress.    Appearance: Normal appearance.  HENT:     Right Ear: Tympanic membrane and ear canal normal.     Left Ear: Tympanic membrane and ear canal normal.     Nose: Nose normal. No congestion or rhinorrhea.  Eyes:     Conjunctiva/sclera: Conjunctivae normal.  Neck:     Thyroid: No thyroid mass.  Cardiovascular:     Rate and Rhythm: Normal rate and regular rhythm.     Heart sounds: Normal heart sounds. No murmur heard.   Pulmonary:     Effort: Pulmonary effort is normal.  Breath sounds: Normal breath sounds.  Abdominal:     General: Bowel sounds are normal.     Palpations: Abdomen is soft. There is no mass.     Tenderness: There is no abdominal tenderness.  Neurological:     Mental Status: She is alert and oriented to person, place, and time.     Cranial Nerves: No cranial nerve deficit.  Psychiatric:        Mood and Affect: Mood normal.        Behavior: Behavior normal.     Assessment & Plan:  1. Menorrhagia with regular cycle - CBC with Differential/Platelet  2. Other fatigue - CBC with Differential/Platelet - Comprehensive metabolic panel - TSH  3. Other iron deficiency anemia - CBC with Differential/Platelet  4. Need for Tdap vaccination - Tdap vaccine greater than or equal to 7yo IM  5. Need for influenza vaccination - Flu Vaccine QUAD 6+ mos  PF IM (Fluarix Quad PF)  6. Personal history of COVID-19 - SARS-CoV-2 Semi-Quantitative Total Antibody, Spike  7. Need for malaria prophylaxis - Atovaquone-Proguanil HCl 62.5-25 MG tablet; One tablet daily starting 2 days prior to leaving for endemic area, take throughout trip and for one week after return.  Dispense: 20 tablet; Refill: 0  8. Anticipated travel to [place] Oral typhoid vaccine.   Outpatient Encounter Medications as of 03/23/2021  Medication Sig  . albuterol (PROAIR HFA) 108 (90 Base) MCG/ACT inhaler Inhale 2 puffs into the lungs every 6 (six) hours as needed for wheezing or shortness of breath.  . Atovaquone-Proguanil HCl 62.5-25 MG tablet One tablet daily starting 2 days prior to leaving for endemic area, take throughout trip and for one week after return.  . typhoid (VIVOTIF) DR capsule One tablet every other day x 4 doses. Must complete at least 1 week prior to departure to be effective and one week before starting malaria prophylaxis.  . Multiple Vitamins-Minerals (MULTIVITAMIN WITH MINERALS) tablet Take 1 tablet by mouth daily.  . [DISCONTINUED] albuterol (PROVENTIL HFA;VENTOLIN HFA) 108 (90 BASE) MCG/ACT inhaler Inhale 2 puffs into the lungs every 6 (six) hours as needed for wheezing.   No facility-administered encounter medications on file as of 03/23/2021.    Follow-up: No follow-ups on file.   Blane Ohara, MD

## 2021-03-24 LAB — CBC WITH DIFFERENTIAL/PLATELET
Basophils Absolute: 0.1 10*3/uL (ref 0.0–0.2)
Basos: 1 %
EOS (ABSOLUTE): 0.1 10*3/uL (ref 0.0–0.4)
Eos: 1 %
Hematocrit: 41.6 % (ref 34.0–46.6)
Hemoglobin: 14.1 g/dL (ref 11.1–15.9)
Immature Grans (Abs): 0 10*3/uL (ref 0.0–0.1)
Immature Granulocytes: 0 %
Lymphocytes Absolute: 2.1 10*3/uL (ref 0.7–3.1)
Lymphs: 18 %
MCH: 30.7 pg (ref 26.6–33.0)
MCHC: 33.9 g/dL (ref 31.5–35.7)
MCV: 90 fL (ref 79–97)
Monocytes Absolute: 0.8 10*3/uL (ref 0.1–0.9)
Monocytes: 7 %
Neutrophils Absolute: 8.6 10*3/uL — ABNORMAL HIGH (ref 1.4–7.0)
Neutrophils: 73 %
Platelets: 297 10*3/uL (ref 150–450)
RBC: 4.6 x10E6/uL (ref 3.77–5.28)
RDW: 12.1 % (ref 11.7–15.4)
WBC: 11.8 10*3/uL — ABNORMAL HIGH (ref 3.4–10.8)

## 2021-03-24 LAB — COMPREHENSIVE METABOLIC PANEL
ALT: 7 IU/L (ref 0–32)
AST: 13 IU/L (ref 0–40)
Albumin/Globulin Ratio: 1.7 (ref 1.2–2.2)
Albumin: 5 g/dL (ref 3.9–5.0)
Alkaline Phosphatase: 63 IU/L (ref 44–121)
BUN/Creatinine Ratio: 11 (ref 9–23)
BUN: 9 mg/dL (ref 6–20)
Bilirubin Total: 0.3 mg/dL (ref 0.0–1.2)
CO2: 21 mmol/L (ref 20–29)
Calcium: 9.6 mg/dL (ref 8.7–10.2)
Chloride: 102 mmol/L (ref 96–106)
Creatinine, Ser: 0.8 mg/dL (ref 0.57–1.00)
Globulin, Total: 2.9 g/dL (ref 1.5–4.5)
Glucose: 90 mg/dL (ref 65–99)
Potassium: 4.3 mmol/L (ref 3.5–5.2)
Sodium: 138 mmol/L (ref 134–144)
Total Protein: 7.9 g/dL (ref 6.0–8.5)
eGFR: 103 mL/min/{1.73_m2} (ref >59)

## 2021-03-24 LAB — SARS-COV-2 SEMI-QUANTITATIVE TOTAL ANTIBODY, SPIKE
SARS-CoV-2 Semi-Quant Total Ab: <0.8 U/mL (ref <0.8)
SARS-CoV-2 Spike Ab Interp: NEGATIVE

## 2021-03-24 LAB — TSH: TSH: 1.48 u[IU]/mL (ref 0.450–4.500)

## 2021-03-24 MED ORDER — ATOVAQUONE-PROGUANIL HCL 62.5-25 MG PO TABS: ORAL_TABLET | ORAL | 0 refills | Status: AC

## 2021-03-24 MED ORDER — TYPHOID VACCINE PO CPDR: DELAYED_RELEASE_CAPSULE | ORAL | 0 refills | Status: AC

## 2021-04-02 ENCOUNTER — Encounter: Payer: Self-pay | Admitting: Family Medicine
